# Patient Record
Sex: Male | Born: 1965
Health system: Southern US, Community
[De-identification: ages and names within clinical notes are randomized; demographics above are authoritative.]

## PROBLEM LIST (undated history)

## (undated) DIAGNOSIS — I10 Essential (primary) hypertension: Secondary | ICD-10-CM

## (undated) DIAGNOSIS — R002 Palpitations: Secondary | ICD-10-CM

## (undated) HISTORY — DX: Palpitations: R00.2

## (undated) HISTORY — PX: BACK SURGERY: SHX140

## (undated) HISTORY — PX: KNEE SURGERY: SHX244

---

## 2002-08-04 ENCOUNTER — Ambulatory Visit (HOSPITAL_COMMUNITY): Admission: RE | Admit: 2002-08-04 | Discharge: 2002-08-04 | Payer: Self-pay | Admitting: Family Medicine

## 2002-08-04 ENCOUNTER — Encounter: Payer: Self-pay | Admitting: Family Medicine

## 2003-02-10 ENCOUNTER — Encounter: Payer: Self-pay | Admitting: Family Medicine

## 2003-02-10 ENCOUNTER — Ambulatory Visit (HOSPITAL_COMMUNITY): Admission: RE | Admit: 2003-02-10 | Discharge: 2003-02-10 | Payer: Self-pay | Admitting: Family Medicine

## 2005-10-28 ENCOUNTER — Emergency Department (HOSPITAL_COMMUNITY): Admission: EM | Admit: 2005-10-28 | Discharge: 2005-10-28 | Payer: Self-pay | Admitting: Emergency Medicine

## 2005-11-14 ENCOUNTER — Ambulatory Visit (HOSPITAL_COMMUNITY): Admission: RE | Admit: 2005-11-14 | Discharge: 2005-11-14 | Payer: Self-pay | Admitting: Family Medicine

## 2007-04-11 ENCOUNTER — Ambulatory Visit (HOSPITAL_COMMUNITY): Admission: RE | Admit: 2007-04-11 | Discharge: 2007-04-11 | Payer: Self-pay | Admitting: Family Medicine
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
## 2011-08-16 ENCOUNTER — Ambulatory Visit (HOSPITAL_COMMUNITY)
Admission: RE | Admit: 2011-08-16 | Discharge: 2011-08-16 | Disposition: A | Discharge status: Home / Self Care | Payer: 59 | Source: Ambulatory Visit | Attending: Family Medicine | Admitting: Family Medicine

## 2011-08-16 ENCOUNTER — Other Ambulatory Visit (HOSPITAL_COMMUNITY): Payer: Self-pay | Admitting: Family Medicine

## 2011-08-16 DIAGNOSIS — N201 Calculus of ureter: Secondary | ICD-10-CM | POA: Insufficient documentation

## 2011-08-16 DIAGNOSIS — N200 Calculus of kidney: Secondary | ICD-10-CM

## 2011-08-16 DIAGNOSIS — R1031 Right lower quadrant pain: Secondary | ICD-10-CM | POA: Insufficient documentation

## 2011-08-16 DIAGNOSIS — R319 Hematuria, unspecified: Secondary | ICD-10-CM | POA: Insufficient documentation

## 2011-08-16 DIAGNOSIS — R1032 Left lower quadrant pain: Secondary | ICD-10-CM | POA: Insufficient documentation

## 2012-10-04 ENCOUNTER — Ambulatory Visit (HOSPITAL_COMMUNITY)
Admission: RE | Admit: 2012-10-04 | Discharge: 2012-10-04 | Disposition: A | Discharge status: Home / Self Care | Payer: 59 | Source: Ambulatory Visit | Attending: Internal Medicine | Admitting: Internal Medicine

## 2012-10-04 ENCOUNTER — Other Ambulatory Visit (HOSPITAL_COMMUNITY): Payer: Self-pay | Admitting: Internal Medicine

## 2012-10-04 DIAGNOSIS — R1013 Epigastric pain: Secondary | ICD-10-CM

## 2012-10-04 DIAGNOSIS — R1110 Vomiting, unspecified: Secondary | ICD-10-CM | POA: Insufficient documentation

## 2012-10-04 DIAGNOSIS — K7689 Other specified diseases of liver: Secondary | ICD-10-CM | POA: Insufficient documentation

## 2014-01-30 ENCOUNTER — Emergency Department (HOSPITAL_COMMUNITY)
Admission: EM | Admit: 2014-01-30 | Discharge: 2014-01-30 | Disposition: A | Discharge status: Home / Self Care | Payer: 59 | Attending: Emergency Medicine | Admitting: Emergency Medicine

## 2014-01-30 ENCOUNTER — Encounter (HOSPITAL_COMMUNITY): Payer: Self-pay | Admitting: Emergency Medicine

## 2014-01-30 ENCOUNTER — Emergency Department (HOSPITAL_COMMUNITY): Payer: 59

## 2014-01-30 DIAGNOSIS — R109 Unspecified abdominal pain: Secondary | ICD-10-CM | POA: Insufficient documentation

## 2014-01-30 DIAGNOSIS — Z79899 Other long term (current) drug therapy: Secondary | ICD-10-CM | POA: Insufficient documentation

## 2014-01-30 DIAGNOSIS — Z791 Long term (current) use of non-steroidal anti-inflammatories (NSAID): Secondary | ICD-10-CM | POA: Insufficient documentation

## 2014-01-30 DIAGNOSIS — N201 Calculus of ureter: Secondary | ICD-10-CM | POA: Insufficient documentation

## 2014-01-30 LAB — URINALYSIS, ROUTINE W REFLEX MICROSCOPIC
Bilirubin Urine: NEGATIVE
GLUCOSE, UA: NEGATIVE mg/dL
HGB URINE DIPSTICK: NEGATIVE
KETONES UR: 15 mg/dL — AB
LEUKOCYTES UA: NEGATIVE
NITRITE: NEGATIVE
PH: 5.5 (ref 5.0–8.0)
PROTEIN: NEGATIVE mg/dL
SPECIFIC GRAVITY, URINE: 1.025 (ref 1.005–1.030)
UROBILINOGEN UA: 0.2 mg/dL (ref 0.0–1.0)

## 2014-01-30 LAB — COMPREHENSIVE METABOLIC PANEL
ALT: 34 U/L (ref 0–53)
AST: 31 U/L (ref 0–37)
Albumin: 4.5 g/dL (ref 3.5–5.2)
Alkaline Phosphatase: 62 U/L (ref 39–117)
Anion gap: 15 (ref 5–15)
BILIRUBIN TOTAL: 1.1 mg/dL (ref 0.3–1.2)
BUN: 21 mg/dL (ref 6–23)
CO2: 24 meq/L (ref 19–32)
Calcium: 9.7 mg/dL (ref 8.4–10.5)
Chloride: 102 mEq/L (ref 96–112)
Creatinine, Ser: 1.6 mg/dL — ABNORMAL HIGH (ref 0.50–1.35)
GFR calc Af Amer: 57 mL/min — ABNORMAL LOW (ref >90)
GFR calc non Af Amer: 49 mL/min — ABNORMAL LOW (ref >90)
Glucose, Bld: 114 mg/dL — ABNORMAL HIGH (ref 70–99)
POTASSIUM: 4.4 meq/L (ref 3.7–5.3)
Sodium: 141 mEq/L (ref 137–147)
TOTAL PROTEIN: 7.9 g/dL (ref 6.0–8.3)

## 2014-01-30 LAB — CBC
HEMATOCRIT: 41.8 % (ref 39.0–52.0)
Hemoglobin: 14.9 g/dL (ref 13.0–17.0)
MCH: 32.3 pg (ref 26.0–34.0)
MCHC: 35.6 g/dL (ref 30.0–36.0)
MCV: 90.7 fL (ref 78.0–100.0)
Platelets: 240 10*3/uL (ref 150–400)
RBC: 4.61 MIL/uL (ref 4.22–5.81)
RDW: 12 % (ref 11.5–15.5)
WBC: 14.3 10*3/uL — ABNORMAL HIGH (ref 4.0–10.5)

## 2014-01-30 MED ORDER — TAMSULOSIN HCL 0.4 MG PO CAPS: 0.4000 mg | ORAL_CAPSULE | Freq: Every day | ORAL | Status: DC

## 2014-01-30 MED ORDER — SODIUM CHLORIDE 0.9 % IV SOLN
1000.0000 mL | INTRAVENOUS | Status: DC
Start: 2014-01-30 — End: 2014-01-31

## 2014-01-30 MED ORDER — HYDROCODONE-ACETAMINOPHEN 5-325 MG PO TABS: 1.0000 | ORAL_TABLET | ORAL | Status: DC | PRN

## 2014-01-30 MED ORDER — SODIUM CHLORIDE 0.9 % IV SOLN: 1000.0000 mL | Freq: Once | INTRAVENOUS | Status: DC

## 2014-01-30 MED ORDER — ONDANSETRON HCL 8 MG PO TABS: 8.0000 mg | ORAL_TABLET | Freq: Three times a day (TID) | ORAL | Status: DC | PRN

## 2014-01-30 MED ORDER — SODIUM CHLORIDE 0.9 % IV SOLN
1000.0000 mL | Freq: Once | INTRAVENOUS | Status: AC
Start: 2014-01-30 — End: 2014-01-30
  Administered 2014-01-30: 1000 mL via INTRAVENOUS

## 2014-01-30 MED ORDER — ONDANSETRON HCL 4 MG/2ML IJ SOLN
4.0000 mg | Freq: Once | INTRAMUSCULAR | Status: AC
  Administered 2014-01-30: 4 mg via INTRAVENOUS
  Filled 2014-01-30: qty 2

## 2014-01-30 MED ORDER — HYDROMORPHONE HCL PF 1 MG/ML IJ SOLN
1.0000 mg | INTRAMUSCULAR | Status: DC | PRN
  Administered 2014-01-30: 1 mg via INTRAVENOUS
  Filled 2014-01-30: qty 1

## 2014-01-30 MED ORDER — HYDROMORPHONE HCL PF 1 MG/ML IJ SOLN
1.0000 mg | Freq: Once | INTRAMUSCULAR | Status: AC
  Administered 2014-01-30: 1 mg via INTRAVENOUS
  Filled 2014-01-30: qty 1

## 2014-01-30 MED ORDER — TAMSULOSIN HCL 0.4 MG PO CAPS
0.4000 mg | ORAL_CAPSULE | Freq: Once | ORAL | Status: AC
  Administered 2014-01-30: 0.4 mg via ORAL
  Filled 2014-01-30: qty 1

## 2014-01-30 NOTE — Discharge Instructions (Signed)
Kidney Stones °Kidney stones (urolithiasis) are deposits that form inside your kidneys. The intense pain is caused by the stone moving through the urinary tract. When the stone moves, the ureter goes into spasm around the stone. The stone is usually passed in the urine.  °CAUSES  °· A disorder that makes certain neck glands produce too much parathyroid hormone (primary hyperparathyroidism). °· A buildup of uric acid crystals, similar to gout in your joints. °· Narrowing (stricture) of the ureter. °· A kidney obstruction present at birth (congenital obstruction). °· Previous surgery on the kidney or ureters. °· Numerous kidney infections. °SYMPTOMS  °· Feeling sick to your stomach (nauseous). °· Throwing up (vomiting). °· Blood in the urine (hematuria). °· Pain that usually spreads (radiates) to the groin. °· Frequency or urgency of urination. °DIAGNOSIS  °· Taking a history and physical exam. °· Blood or urine tests. °· CT scan. °· Occasionally, an examination of the inside of the urinary bladder (cystoscopy) is performed. °TREATMENT  °· Observation. °· Increasing your fluid intake. °· Extracorporeal shock wave lithotripsy--This is a noninvasive procedure that uses shock waves to break up kidney stones. °· Surgery may be needed if you have severe pain or persistent obstruction. There are various surgical procedures. Most of the procedures are performed with the use of small instruments. Only small incisions are needed to accommodate these instruments, so recovery time is minimized. °The size, location, and chemical composition are all important variables that will determine the proper choice of action for you. Talk to your health care provider to better understand your situation so that you will minimize the risk of injury to yourself and your kidney.  °HOME CARE INSTRUCTIONS  °· Drink enough water and fluids to keep your urine clear or pale yellow. This will help you to pass the stone or stone fragments. °· Strain  all urine through the provided strainer. Keep all particulate matter and stones for your health care provider to see. The stone causing the pain may be as small as a grain of salt. It is very important to use the strainer each and every time you pass your urine. The collection of your stone will allow your health care provider to analyze it and verify that a stone has actually passed. The stone analysis will often identify what you can do to reduce the incidence of recurrences. °· Only take over-the-counter or prescription medicines for pain, discomfort, or fever as directed by your health care provider. °· Make a follow-up appointment with your health care provider as directed. °· Get follow-up X-rays if required. The absence of pain does not always mean that the stone has passed. It may have only stopped moving. If the urine remains completely obstructed, it can cause loss of kidney function or even complete destruction of the kidney. It is your responsibility to make sure X-rays and follow-ups are completed. Ultrasounds of the kidney can show blockages and the status of the kidney. Ultrasounds are not associated with any radiation and can be performed easily in a matter of minutes. °SEEK MEDICAL CARE IF: °· You experience pain that is progressive and unresponsive to any pain medicine you have been prescribed. °SEEK IMMEDIATE MEDICAL CARE IF:  °· Pain cannot be controlled with the prescribed medicine. °· You have a fever or shaking chills. °· The severity or intensity of pain increases over 18 hours and is not relieved by pain medicine. °· You develop a new onset of abdominal pain. °· You feel faint or pass out. °·   You are unable to urinate. MAKE SURE YOU:   Understand these instructions.  Will watch your condition.  Will get help right away if you are not doing well or get worse. Document Released: 05/15/2005 Document Revised: 01/15/2013 Document Reviewed: 10/16/2012 [facility] Patient Information 2015  [place], [place]. This information is not intended to replace advice given to you by your health care provider. Make sure you discuss any questions you have with your health care provider.   Take your next dose of the flomax tomorrow evening.  Strain your urine so you will know when the kidney stone passes.  Return here if you develop fevers, uncontrolled vomiting or worse pain.  You may benefit from seeing a urologist - see the referral above for this.

## 2014-01-30 NOTE — ED Notes (Signed)
Onset today, sudden pain, Pain on right side, flank pain, hx of kidney stones, vomiting and nausea

## 2014-01-30 NOTE — ED Provider Notes (Signed)
CSN: [id number]     Arrival date & time 01/30/14  1941 History   First MD Initiated Contact with Patient 01/30/14 2202     Chief Complaint  Patient presents with  . Flank Pain     (Consider location/radiation/quality/duration/timing/severity/associated sxs/prior Treatment) The history is provided by the patient.   Jose Oliver is a 48 y.o. male presenting with acute and sudden pain his right flank starting mid afternoon today.  This was accompanied by nausea with vomiting and was not accompanied by abdominal pain, hematuria or increased urinary frequency.  He has had no fevers or chills. He does have a history of kidney stones,  The last occurred approximately 2 years ago which he passed without complication.  He took an old, expired vicodin prior to arrival which did not relieve his pain.      No past medical history on file. Past Surgical History  Procedure Laterality Date  . Back surgery    . Knee surgery     No family history on file. History  Substance Use Topics  . Smoking status: Never Smoker   . Smokeless tobacco: Not on file  . Alcohol Use: Yes     Comment: socially    Review of Systems    Allergies  Review of patient's allergies indicates no known allergies.  Home Medications   Prior to Admission medications   Medication Sig Start Date End Date Taking? Authorizing Provider  losartan (COZAAR) 100 MG tablet Take 100 mg by mouth daily. 12/05/13  Yes Historical Provider, MD  meloxicam (MOBIC) 15 MG tablet Take 15 mg by mouth daily as needed. For pain 12/05/13  Yes Historical Provider, MD  HYDROcodone-acetaminophen (NORCO/VICODIN) 5-325 MG per tablet Take 1-2 tablets by mouth every 4 (four) hours as needed. 01/30/14   Evalee Jefferson, PA-C  ondansetron (ZOFRAN) 8 MG tablet Take 1 tablet (8 mg total) by mouth every 8 (eight) hours as needed for nausea or vomiting. 01/30/14   Evalee Jefferson, PA-C  tamsulosin (FLOMAX) 0.4 MG CAPS capsule Take 1 capsule (0.4 mg total) by mouth daily  after supper. 01/30/14   Evalee Jefferson, PA-C   BP 133/80  Pulse 66  Temp(Src) 98.4 F (36.9 C) (Oral)  Resp 18  Ht 5\' 11"  (1.803 m)  Wt 214 lb (97.07 kg)  BMI 29.86 kg/m2  SpO2 99% Physical Exam  ED Course  Procedures (including critical care time) Labs Review Labs Reviewed  URINALYSIS, ROUTINE W REFLEX MICROSCOPIC - Abnormal; Notable for the following:    Ketones, ur 15 (*)    All other components within normal limits  CBC - Abnormal; Notable for the following:    WBC 14.3 (*)    All other components within normal limits  COMPREHENSIVE METABOLIC PANEL - Abnormal; Notable for the following:    Glucose, Bld 114 (*)    Creatinine, Ser 1.60 (*)    GFR calc non Af Amer 49 (*)    GFR calc Af Amer 57 (*)    All other components within normal limits    Imaging Review Ct Abdomen Pelvis Wo Contrast  01/30/2014   CLINICAL DATA:  Right-sided flank pain with nausea and vomiting.  EXAM: CT ABDOMEN AND PELVIS WITHOUT CONTRAST  TECHNIQUE: Multidetector CT imaging of the abdomen and pelvis was performed following the standard protocol without IV contrast.  COMPARISON:  08/16/2011  FINDINGS: Mild right hydronephrosis is identified. There is a calculus located in the proximal ureter measuring approximately 3 mm in diameter and located approximately  at the level of the superior endplate of the L3 vertebral body. No other calculi are identified in either kidney, ureters or bladder.  Unenhanced appearance of the liver, gallbladder, pancreas, spleen, adrenal glands and bowel are unremarkable. No abnormal fluid collections. No masses or enlarged lymph nodes are seen. Degenerative disease of the lower lumbar spine seen at the L4-5 and L5-S1 levels.  IMPRESSION: Mild right hydronephrosis secondary to a 3 mm proximal ureteral calculus.   Electronically Signed   By: Aletta Edouard M.D.   On: 01/30/2014 22:10     EKG Interpretation None      MDM   Final diagnoses:  Right ureteral stone    Patients labs  and/or radiological studies were viewed and considered during the medical decision making and disposition process. Patient was prescribed hydrocodone, Zofran and Flomax.  He was given his first dose of Flomax tonight before discharge home.  Advised to followup with his PCP as needed, he was also given a urology referral.  He was also instructed to return here over the weekend if he develops fevers, worse pain or uncontrolled vomiting.    Evalee Jefferson, PA-C 01/30/14 2304

## 2014-02-01 NOTE — ED Provider Notes (Signed)
Medical screening examination/treatment/procedure(s) were performed by non-physician practitioner and as supervising physician I was immediately available for consultation/collaboration.   EKG Interpretation None        Fredia Sorrow, MD 02/01/14 0201

## 2014-02-23 ENCOUNTER — Other Ambulatory Visit (HOSPITAL_COMMUNITY): Payer: Self-pay | Admitting: Family Medicine

## 2014-02-23 ENCOUNTER — Ambulatory Visit (HOSPITAL_COMMUNITY)
Admission: RE | Admit: 2014-02-23 | Discharge: 2014-02-23 | Disposition: A | Discharge status: Home / Self Care | Payer: 59 | Source: Ambulatory Visit | Attending: Family Medicine | Admitting: Family Medicine

## 2014-02-23 DIAGNOSIS — N200 Calculus of kidney: Secondary | ICD-10-CM | POA: Insufficient documentation

## 2016-02-01 NOTE — H&P (Signed)
  NTS SOAP Note  Vital Signs:  Vitals as of: 0000000: Systolic 0000000: Diastolic 90: Heart Rate 77: Temp 97.12F (Temporal): Height 62ft 11in: Weight 223Lbs 0 Ounces: BMI 31.1   BMI : 31.1 kg/m2  Subjective: This 50 year old male presents for of referral for need for screening TCS.  Never has had a colonoscopy.  No family h/o colon cancer.  Denies, weight loss, blood in stools, abdominal pain, diarrhea, constipation.  Review of Symptoms:  Constitutional:negative Head:negative Eyes:negative Nose/Mouth/Throat:negative Cardiovascular:negative Respiratory:negative Gastrointestinnegative Genitourinary:negative joint and back pain Skin:negative Hematolgic/Lymphatic:negative Allergic/Immunologic:negative   Past Medical History:Reviewed  Past Medical History  Surgical History: bilateral knee, back surgeries Medical Problems: HTN, BPH Allergies: nkda Medications: baby asa, cozaar, mobic prn, tamsulosin   Social History:Reviewed  Social History  Preferred Language: English Race:  White Ethnicity: Not Hispanic / Latino Age: 77 year Marital Status:  M Alcohol: no   Smoking Status: Never smoker reviewed on 02/01/2016 Functional Status reviewed on 02/01/2016 ------------------------------------------------ Bathing: Normal Cooking: Normal Dressing: Normal Driving: Normal Eating: Normal Managing Meds: Normal Oral Care: Normal Shopping: Normal Toileting: Normal Transferring: Normal Walking: Normal Cognitive Status reviewed on 02/01/2016 ------------------------------------------------ Attention: Normal Decision Making: Normal Language: Normal Memory: Normal Motor: Normal Perception: Normal Problem Solving: Normal Visual and Spatial: Normal   Family History:Reviewed  Family Health History Mother, Living; Diverticulitis of colon;  Father, Deceased; Prostate cancer;     Objective Information: General:Well appearing, well nourished in no  distress. Head:Atraumatic; no masses; no abnormalities Neck:Supple without lymphadenopathy.  Heart:RRR, no murmur or gallop.  Normal S1, S2.  No S3, S4.  Lungs:CTA bilaterally, no wheezes, rhonchi, rales.  Breathing unlabored. Abdomen:Soft, NT/ND, normal bowel sounds, no HSM, no masses.  No peritoneal signs. deferrred to procedure Dr. Delanna Ahmadi notes reviewed Assessment:Need for screening TCS  Diagnoses: V76.51  Z12.11 Screening for malignant neoplasm of colon (Encounter for screening for malignant neoplasm of colon)  Procedures: [id number] - OFFICE OUTPATIENT NEW 30 MINUTES    Plan:  Scheduled for screening TCS on 02/15/16.  Trilyte prescribed.   Patient Education:Alternative treatments to surgery were discussed with patient (and family).Risks and benefits  of procedure including bleeding and perforation were fully explained to the patient (and family) who gave informed consent. Patient/family questions were addressed.  Follow-up:Pending Surgery

## 2016-02-15 ENCOUNTER — Encounter (HOSPITAL_COMMUNITY): Payer: Self-pay

## 2016-02-15 ENCOUNTER — Ambulatory Visit (HOSPITAL_COMMUNITY): Admit: 2016-02-15 | Payer: Self-pay | Admitting: General Surgery

## 2016-02-15 SURGERY — COLONOSCOPY
Anesthesia: Moderate Sedation

## 2016-05-05 ENCOUNTER — Other Ambulatory Visit (HOSPITAL_COMMUNITY): Payer: Self-pay | Admitting: Family Medicine

## 2016-05-05 ENCOUNTER — Ambulatory Visit (HOSPITAL_COMMUNITY)
Admission: RE | Admit: 2016-05-05 | Discharge: 2016-05-05 | Disposition: A | Discharge status: Home / Self Care | Payer: 59 | Source: Ambulatory Visit | Attending: Family Medicine | Admitting: Family Medicine

## 2016-05-05 DIAGNOSIS — M25552 Pain in left hip: Secondary | ICD-10-CM | POA: Insufficient documentation

## 2016-05-05 DIAGNOSIS — M1612 Unilateral primary osteoarthritis, left hip: Secondary | ICD-10-CM | POA: Insufficient documentation

## 2016-05-17 ENCOUNTER — Telehealth: Payer: Self-pay

## 2016-05-17 NOTE — Telephone Encounter (Signed)
[phone] PATIENT RECEIVED LETTER TO SCHEDULE TCS

## 2016-05-25 ENCOUNTER — Telehealth: Payer: Self-pay

## 2016-05-25 NOTE — Telephone Encounter (Signed)
LMOM to call.

## 2016-05-25 NOTE — Telephone Encounter (Signed)
Pt has been triaged.

## 2016-06-01 NOTE — Telephone Encounter (Signed)
Gastroenterology Pre-Procedure Review  Request Date: 05/25/2016 Requesting Physician: Dr. Gerarda Fraction  PATIENT REVIEW QUESTIONS: The patient responded to the following health history questions as indicated:    1. Diabetes Melitis: no 2. Joint replacements in the past 12 months: no 3. Major health problems in the past 3 months: no 4. Has an artificial valve or MVP: no 5. Has a defibrillator: no 6. Has been advised in past to take antibiotics in advance of a procedure like teeth cleaning: no 7. Family history of colon cancer: YES    Father 8. Alcohol Use: no 9. History of sleep apnea: no  10. History of coronary artery or other vascular stents placed within the last 12 months: no    MEDICATIONS & ALLERGIES:    Patient reports the following regarding taking any blood thinners:   Plavix? no Aspirin? no Coumadin? no Brilinta? no Xarelto? no Eliquis? no Pradaxa? no Savaysa? no Effient? no  Patient confirms/reports the following medications:  Current Outpatient Prescriptions  Medication Sig Dispense Refill  . losartan (COZAAR) 100 MG tablet Take 100 mg by mouth daily.    . meloxicam (MOBIC) 15 MG tablet Take 15 mg by mouth daily as needed. For pain    . tamsulosin (FLOMAX) 0.4 MG CAPS capsule Take 1 capsule (0.4 mg total) by mouth daily after supper. (Patient not taking: Reported on 05/25/2016) 5 capsule 0   No current facility-administered medications for this visit.     Patient confirms/reports the following allergies:  No Known Allergies  No orders of the defined types were placed in this encounter.   AUTHORIZATION INFORMATION Primary Insurance:   ID #:Group #:  Pre-Cert / Auth required:  Pre-Cert / Auth #:   Secondary Insurance:   ID #:   Group #:  Pre-Cert / Auth required:  Pre-Cert / Auth #:   SCHEDULE INFORMATION: Procedure has been scheduled as follows:  Date: 06/29/2016             Time:  8:30 AM Location: [facility]  This Gastroenterology  Pre-Precedure Review Form is being routed to the following provider(s): R. Garfield Cornea, MD

## 2016-06-07 NOTE — Telephone Encounter (Signed)
Ok to schedule.

## 2016-06-08 ENCOUNTER — Other Ambulatory Visit: Payer: Self-pay

## 2016-06-08 DIAGNOSIS — Z1211 Encounter for screening for malignant neoplasm of colon: Secondary | ICD-10-CM

## 2016-06-08 MED ORDER — NA SULFATE-K SULFATE-MG SULF 17.5-3.13-1.6 GM/177ML PO SOLN: 1.0000 | ORAL | 0 refills | Status: DC

## 2016-06-08 NOTE — Telephone Encounter (Signed)
Rx sent to the pharmacy and instructions mailed to pt.  

## 2016-06-26 ENCOUNTER — Telehealth: Payer: Self-pay

## 2016-06-26 NOTE — Telephone Encounter (Signed)
I called pt to update triage and he has not had any change in his meds since he was triaged.

## 2016-06-27 ENCOUNTER — Telehealth: Payer: Self-pay

## 2016-06-27 NOTE — Telephone Encounter (Signed)
Ok to schedule.

## 2016-06-27 NOTE — Telephone Encounter (Signed)
I called [employer] @ [phone] and spoke to [place] who said PA is required for the screening colonoscopy as outpatient.  PA# [id number].

## 2016-06-29 ENCOUNTER — Encounter (HOSPITAL_COMMUNITY): Payer: Self-pay | Admitting: *Deleted

## 2016-06-29 ENCOUNTER — Ambulatory Visit (HOSPITAL_COMMUNITY)
Admission: RE | Admit: 2016-06-29 | Discharge: 2016-06-29 | Disposition: A | Discharge status: Home / Self Care | Payer: 59 | Source: Ambulatory Visit | Attending: Internal Medicine | Admitting: Internal Medicine

## 2016-06-29 ENCOUNTER — Encounter (HOSPITAL_COMMUNITY)
Admission: RE | Disposition: A | Discharge status: Home / Self Care | Payer: Self-pay | Source: Ambulatory Visit | Attending: Internal Medicine

## 2016-06-29 DIAGNOSIS — Z79899 Other long term (current) drug therapy: Secondary | ICD-10-CM | POA: Diagnosis not present

## 2016-06-29 DIAGNOSIS — D124 Benign neoplasm of descending colon: Secondary | ICD-10-CM

## 2016-06-29 DIAGNOSIS — Z1211 Encounter for screening for malignant neoplasm of colon: Secondary | ICD-10-CM | POA: Diagnosis not present

## 2016-06-29 DIAGNOSIS — Z1212 Encounter for screening for malignant neoplasm of rectum: Secondary | ICD-10-CM

## 2016-06-29 DIAGNOSIS — I10 Essential (primary) hypertension: Secondary | ICD-10-CM | POA: Diagnosis not present

## 2016-06-29 DIAGNOSIS — K5730 Diverticulosis of large intestine without perforation or abscess without bleeding: Secondary | ICD-10-CM | POA: Insufficient documentation

## 2016-06-29 DIAGNOSIS — K6389 Other specified diseases of intestine: Secondary | ICD-10-CM | POA: Insufficient documentation

## 2016-06-29 HISTORY — PX: POLYPECTOMY: SHX5525

## 2016-06-29 HISTORY — PX: COLONOSCOPY: SHX5424

## 2016-06-29 HISTORY — DX: Essential (primary) hypertension: I10

## 2016-06-29 SURGERY — COLONOSCOPY
Anesthesia: Moderate Sedation

## 2016-06-29 MED ORDER — MEPERIDINE HCL 100 MG/ML IJ SOLN
INTRAMUSCULAR | Status: DC
Start: 2016-06-29 — End: 2016-06-29
  Filled 2016-06-29: qty 2

## 2016-06-29 MED ORDER — MIDAZOLAM HCL 5 MG/5ML IJ SOLN
INTRAMUSCULAR | Status: AC
  Filled 2016-06-29: qty 10

## 2016-06-29 MED ORDER — MEPERIDINE HCL 100 MG/ML IJ SOLN
INTRAMUSCULAR | Status: DC | PRN
  Administered 2016-06-29: 50 mg via INTRAVENOUS
  Administered 2016-06-29 (×2): 25 mg via INTRAVENOUS

## 2016-06-29 MED ORDER — ONDANSETRON HCL 4 MG/2ML IJ SOLN
INTRAMUSCULAR | Status: DC | PRN
  Administered 2016-06-29: 4 mg via INTRAVENOUS

## 2016-06-29 MED ORDER — STERILE WATER FOR IRRIGATION IR SOLN
Status: DC | PRN
  Administered 2016-06-29: 2.5 mL

## 2016-06-29 MED ORDER — SODIUM CHLORIDE 0.9 % IV SOLN
INTRAVENOUS | Status: DC
  Administered 2016-06-29: 07:00:00 via INTRAVENOUS

## 2016-06-29 MED ORDER — MIDAZOLAM HCL 5 MG/5ML IJ SOLN
INTRAMUSCULAR | Status: DC | PRN
  Administered 2016-06-29 (×2): 1 mg via INTRAVENOUS
  Administered 2016-06-29 (×2): 2 mg via INTRAVENOUS

## 2016-06-29 MED ORDER — ONDANSETRON HCL 4 MG/2ML IJ SOLN
INTRAMUSCULAR | Status: AC
  Filled 2016-06-29: qty 2

## 2016-06-29 NOTE — Discharge Instructions (Addendum)
Diverticulosis Diverticulosis is the condition that develops when small pouches (diverticula) form in the wall of your colon. Your colon, or large intestine, is where water is absorbed and stool is formed. The pouches form when the inside layer of your colon pushes through weak spots in the outer layers of your colon. CAUSES  No one knows exactly what causes diverticulosis. RISK FACTORS  Being older than 15. Your risk for this condition increases with age. Diverticulosis is rare in people younger than 40 years. By age 21, almost everyone has it.  Eating a low-fiber diet.  Being frequently constipated.  Being overweight.  Not getting enough exercise.  Smoking.  Taking over-the-counter pain medicines, like aspirin and ibuprofen. SYMPTOMS  Most people with diverticulosis do not have symptoms. DIAGNOSIS  Because diverticulosis often has no symptoms, health care providers often discover the condition during an exam for other colon problems. In many cases, a health care provider will diagnose diverticulosis while using a flexible scope to examine the colon (colonoscopy). TREATMENT  If you have never developed an infection related to diverticulosis, you may not need treatment. If you have had an infection before, treatment may include:  Eating more fruits, vegetables, and grains.  Taking a fiber supplement.  Taking a live bacteria supplement (probiotic).  Taking medicine to relax your colon. HOME CARE INSTRUCTIONS   Drink at least 6-8 glasses of water each day to prevent constipation.  Try not to strain when you have a bowel movement.  Keep all follow-up appointments. If you have had an infection before:  Increase the fiber in your diet as directed by your health care provider or dietitian.  Take a dietary fiber supplement if your health care provider approves.  Only take medicines as directed by your health care provider. SEEK MEDICAL CARE IF:   You have abdominal  pain.  You have bloating.  You have cramps.  You have not gone to the bathroom in 3 days. SEEK IMMEDIATE MEDICAL CARE IF:   Your pain gets worse.  Yourbloating becomes very bad.  You have a fever or chills, and your symptoms suddenly get worse.  You begin vomiting.  You have bowel movements that are bloody or black. MAKE SURE YOU:  Understand these instructions.  Will watch your condition.  Will get help right away if you are not doing well or get worse. This information is not intended to replace advice given to you by your health care provider. Make sure you discuss any questions you have with your health care provider. Document Released: 02/10/2004 Document Revised: 05/20/2013 Document Reviewed: 04/09/2013 Elsevier Interactive Patient Education  2017 [employer]   Colonoscopy, Adult, Care After This sheet gives you information about how to care for yourself after your procedure. Your health care provider may also give you more specific instructions. If you have problems or questions, contact your health care provider. What can I expect after the procedure? After the procedure, it is common to have:  A small amount of blood in your stool for 24 hours after the procedure.  Some gas.  Mild abdominal cramping or bloating. Follow these instructions at home: General instructions  For the first 24 hours after the procedure:  Do not drive or use machinery.  Do not sign important documents.  Do not drink alcohol.  Do your regular daily activities at a slower pace than normal.  Eat soft, easy-to-digest foods.  Rest often.  Take over-the-counter or prescription medicines only as told by your health care provider.  It is up to you to get the results of your procedure. Ask your health care provider, or the department performing the procedure, when your results will be ready. Relieving cramping and bloating  Try walking around when you have cramps or feel  bloated.  Apply heat to your abdomen as told by your health care provider. Use a heat source that your health care provider recommends, such as a moist heat pack or a heating pad.  Place a towel between your skin and the heat source.  Leave the heat on for 20-30 minutes.  Remove the heat if your skin turns bright red. This is especially important if you are unable to feel pain, heat, or cold. You may have a greater risk of getting burned. Eating and drinking  Drink enough fluid to keep your urine clear or pale yellow.  Resume your normal diet as instructed by your health care provider. Avoid heavy or fried foods that are hard to digest.  Avoid drinking alcohol for as long as instructed by your health care provider. Contact a health care provider if:  You have blood in your stool 2-3 days after the procedure. Get help right away if:  You have more than a small spotting of blood in your stool.  You pass large blood clots in your stool.  Your abdomen is swollen.  You have nausea or vomiting.  You have a fever.  You have increasing abdominal pain that is not relieved with medicine. This information is not intended to replace advice given to you by your health care provider. Make sure you discuss any questions you have with your health care provider. Document Released: 12/28/2003 Document Revised: 02/07/2016 Document Reviewed: 07/27/2015 Elsevier Interactive Patient Education  2017 [employer]vier Inc.  Colonoscopy Discharge Instructions  Read the instructions outlined below and refer to this sheet in the next few weeks. These discharge instructions provide you with general information on caring for yourself after you leave the hospital. Your doctor may also give you specific instructions. While your treatment has been planned according to the most current medical practices available, unavoidable complications occasionally occur. If you have any problems or questions after discharge, call  Dr. Gala Romney at [phone]. ACTIVITY  You may resume your regular activity, but move at a slower pace for the next 24 hours.   Take frequent rest periods for the next 24 hours.   Walking will help get rid of the air and reduce the bloated feeling in your belly (abdomen).   No driving for 24 hours (because of the medicine (anesthesia) used during the test).    Do not sign any important legal documents or operate any machinery for 24 hours (because of the anesthesia used during the test).  NUTRITION  Drink plenty of fluids.   You may resume your normal diet as instructed by your doctor.   Begin with a light meal and progress to your normal diet. Heavy or fried foods are harder to digest and may make you feel sick to your stomach (nauseated).   Avoid alcoholic beverages for 24 hours or as instructed.  MEDICATIONS  You may resume your normal medications unless your doctor tells you otherwise.  WHAT YOU CAN EXPECT TODAY  Some feelings of bloating in the abdomen.   Passage of more gas than usual.   Spotting of blood in your stool or on the toilet paper.  IF YOU HAD POLYPS REMOVED DURING THE COLONOSCOPY:  No aspirin products for 7 days or as instructed.  No alcohol for 7 days or as instructed.   Eat a soft diet for the next 24 hours.  FINDING OUT THE RESULTS OF YOUR TEST Not all test results are available during your visit. If your test results are not back during the visit, make an appointment with your caregiver to find out the results. Do not assume everything is normal if you have not heard from your caregiver or the medical facility. It is important for you to follow up on all of your test results.  SEEK IMMEDIATE MEDICAL ATTENTION IF:  You have more than a spotting of blood in your stool.   Your belly is swollen (abdominal distention).   You are nauseated or vomiting.   You have a temperature over 101.   You have abdominal pain or discomfort that is severe or gets worse  throughout the day.    Colon diverticulosis and polyp information provided  Further recommendations to follow pending review of pathology report

## 2016-06-29 NOTE — H&P (Signed)
@  DJSH@   Primary Care Physician:  Purvis Kilts, MD Primary Gastroenterologist:  Dr. Gala Romney  Pre-Procedure History & Physical: HPI:  Jose Oliver is a 51 y.o. male is here for a screening colonoscopy. No bowel symptoms. No family history of colon cancer. No prior colonoscopy.  Past Medical History:  Diagnosis Date  . Hypertension     Past Surgical History:  Procedure Laterality Date  . BACK SURGERY    . KNEE SURGERY      Prior to Admission medications   Medication Sig Start Date End Date Taking? Authorizing Provider  losartan (COZAAR) 100 MG tablet Take 100 mg by mouth daily. 12/05/13  Yes Historical Provider, MD  meloxicam (MOBIC) 15 MG tablet Take 15 mg by mouth daily as needed. For pain 12/05/13  Yes Historical Provider, MD  Multiple Vitamins-Minerals (MULTIVITAMIN WITH MINERALS) tablet Take 1 tablet by mouth daily.   Yes Historical Provider, MD  Na Sulfate-K Sulfate-Mg Sulf (SUPREP BOWEL PREP KIT) 17.5-3.13-1.6 GM/180ML SOLN Take 1 kit by mouth as directed. 06/08/16  Yes Daneil Dolin, MD  Omega-3 Fatty Acids (FISH OIL) 1000 MG CAPS Take 1 capsule by mouth daily.   Yes Historical Provider, MD    Allergies as of 06/08/2016  . (No Known Allergies)    Family History  Problem Relation Age of Onset  . Colon cancer Neg Hx     Social History   Social History  . Marital status: Married    Spouse name: N/A  . Number of children: N/A  . Years of education: N/A   Occupational History  . Not on file.   Social History Main Topics  . Smoking status: Never Smoker  . Smokeless tobacco: Never Used  . Alcohol use Yes     Comment: socially  . Drug use: No  . Sexual activity: Not on file   Other Topics Concern  . Not on file   Social History Narrative  . No narrative on file    Review of Systems: See HPI, otherwise negative ROS  Physical Exam: BP 129/83   Pulse 91   Temp 98 F (36.7 C) (Oral)   Resp 16   Ht '5\' 11"'$  (1.803 m)   Wt 220 lb (99.8 kg)   SpO2  96%   BMI 30.68 kg/m  General:   Alert,  Well-developed, well-nourished, pleasant and cooperative in NAD Head:  Normocephalic and atraumatic. Lungs:  Clear throughout to auscultation.   No wheezes, crackles, or rhonchi. No acute distress. Heart:  Regular rate and rhythm; no murmurs, clicks, rubs,  or gallops. Abdomen:  Soft, nontender and nondistended. No masses, hepatosplenomegaly or hernias noted. Normal bowel sounds, without guarding, and without rebound.   Impression/Plan: Jose Oliver is now here to undergo a screening colonoscopy.  First ever average risk screening examination       Risks, benefits, limitations, imponderables and alternatives regarding colonoscopy have been reviewed with the patient. Questions have been answered. All parties agreeable.     Notice:  This dictation was prepared with Dragon dictation along with smaller phrase technology. Any transcriptional errors that result from this process are unintentional and may not be corrected upon review.

## 2016-06-29 NOTE — Op Note (Signed)
[facility] Patient Name: Jose Oliver Procedure Date: 06/29/2016 7:52 AM MRN: [id number] Date of Birth: September 18, 1965 Attending MD: Norvel Richards , MD CSN: [id number] Age: 51 Admit Type: Outpatient Procedure:                Colonoscopy with snare poypectomy Indications:              Screening for colorectal malignant neoplasm Providers:                Norvel Richards, MD, Lurline Del, RN, Aram Candela Referring MD:             Halford Chessman Medicines:                Midazolam 6 mg IV, Meperidine 100 mg IV,                            Ondansetron 4 mg IV Complications:            No immediate complications. Estimated Blood Loss:     Estimated blood loss was minimal. Procedure:                Pre-Anesthesia Assessment:                           - Prior to the procedure, a History and Physical                            was performed, and patient medications and                            allergies were reviewed. The patient's tolerance of                            previous anesthesia was also reviewed. The risks                            and benefits of the procedure and the sedation                            options and risks were discussed with the patient.                            All questions were answered, and informed consent                            was obtained. Prior Anticoagulants: The patient has                            taken no previous anticoagulant or antiplatelet                            agents. ASA Grade Assessment: II - A patient with  mild systemic disease. After reviewing the risks                            and benefits, the patient was deemed in                            satisfactory condition to undergo the procedure.                           After obtaining informed consent, the colonoscope                            was passed under direct vision. Throughout the          procedure, the patient's blood pressure, pulse, and                            oxygen saturations were monitored continuously. The                            EC38-[id number]) scope was introduced through                            the anus and advanced to the the cecum, identified                            by appendiceal orifice and ileocecal valve. The                            entire colon was well visualized. The entire colon                            was well visualized. The quality of the bowel                            preparation was adequate. The quality of the bowel                            preparation was adequate. The entire colon was well                            visualized. The ileocecal valve, appendiceal                            orifice, and rectum were photographed. Scope In: 8:21:52 AM Scope Out: 8:37:43 AM Scope Withdrawal Time: 0 hours 11 minutes 36 seconds  Total Procedure Duration: 0 hours 15 minutes 51 seconds  Findings:      The perianal and digital rectal examinations were normal.      Scattered small and large-mouthed diverticula were found in the sigmoid       colon and descending colon.      A 4 mm polyp was found in the descending colon. The polyp was       semi-pedunculated. The polyp was removed with a cold snare. Resection  and retrieval were complete. Estimated blood loss was minimal.      The exam was otherwise without abnormality on direct and retroflexion       views. Impression:               - Diverticulosis in the sigmoid colon and in the                            descending colon.                           - One 4 mm polyp in the descending colon, removed                            with a cold snare. Resected and retrieved.                           - The examination was otherwise normal on direct                            and retroflexion views. Moderate Sedation:      Moderate (conscious) sedation was administered by  the endoscopy nurse       and supervised by the endoscopist. The following parameters were       monitored: oxygen saturation, heart rate, blood pressure, respiratory       rate, EKG, adequacy of pulmonary ventilation, and response to care.       Total physician intraservice time was 24 minutes. Recommendation:           - Patient has a contact number available for                            emergencies. The signs and symptoms of potential                            delayed complications were discussed with the                            patient. Return to normal activities tomorrow.                            Written discharge instructions were provided to the                            patient.                           - Resume previous diet.                           - Continue present medications.                           - Repeat colonoscopy date to be determined after                            pending pathology results are reviewed  for                            surveillance based on pathology results.                           - Return to GI office (date not yet determined). Procedure Code(s):        --- Professional ---                           [phone], Colonoscopy, flexible; with removal of                            tumor(s), polyp(s), or other lesion(s) by snare                            technique                           99152, Moderate sedation services provided by the                            same physician or other qualified health care                            professional performing the diagnostic or                            therapeutic service that the sedation supports,                            requiring the presence of an independent trained                            observer to assist in the monitoring of the                            patient's level of consciousness and physiological                            status; initial 15 minutes of intraservice  time,                            patient age 54 years or older                           [phone], Moderate sedation services; each additional                            15 minutes intraservice time Diagnosis Code(s):        --- Professional ---                           Z12.11, Encounter for screening for malignant  neoplasm of colon                           D12.4, Benign neoplasm of descending colon                           K57.30, Diverticulosis of large intestine without                            perforation or abscess without bleeding CPT copyright 2016 American Medical Association. All rights reserved. The codes documented in this report are preliminary and upon coder review may  be revised to meet current compliance requirements. Cristopher Estimable. Haelie Clapp, MD Norvel Richards, MD 06/29/2016 8:54:13 AM This report has been signed electronically. Number of Addenda: 0

## 2016-06-30 ENCOUNTER — Encounter (HOSPITAL_COMMUNITY): Payer: Self-pay | Admitting: Internal Medicine

## 2016-07-03 ENCOUNTER — Encounter: Payer: Self-pay | Admitting: Internal Medicine

## 2016-08-01 DIAGNOSIS — D485 Neoplasm of uncertain behavior of skin: Secondary | ICD-10-CM | POA: Diagnosis not present

## 2016-08-01 DIAGNOSIS — L859 Epidermal thickening, unspecified: Secondary | ICD-10-CM | POA: Diagnosis not present

## 2016-08-01 DIAGNOSIS — L309 Dermatitis, unspecified: Secondary | ICD-10-CM | POA: Diagnosis not present

## 2016-10-12 DIAGNOSIS — M5126 Other intervertebral disc displacement, lumbar region: Secondary | ICD-10-CM | POA: Diagnosis not present

## 2016-10-12 DIAGNOSIS — M5410 Radiculopathy, site unspecified: Secondary | ICD-10-CM | POA: Diagnosis not present

## 2016-10-16 DIAGNOSIS — M5136 Other intervertebral disc degeneration, lumbar region: Secondary | ICD-10-CM | POA: Diagnosis not present

## 2016-10-19 DIAGNOSIS — Z0001 Encounter for general adult medical examination with abnormal findings: Secondary | ICD-10-CM | POA: Diagnosis not present

## 2016-10-19 DIAGNOSIS — M5410 Radiculopathy, site unspecified: Secondary | ICD-10-CM | POA: Diagnosis not present

## 2016-10-19 DIAGNOSIS — R7309 Other abnormal glucose: Secondary | ICD-10-CM | POA: Diagnosis not present

## 2016-10-19 DIAGNOSIS — Z1389 Encounter for screening for other disorder: Secondary | ICD-10-CM | POA: Diagnosis not present

## 2016-10-19 DIAGNOSIS — M48061 Spinal stenosis, lumbar region without neurogenic claudication: Secondary | ICD-10-CM | POA: Diagnosis not present

## 2016-10-19 DIAGNOSIS — I10 Essential (primary) hypertension: Secondary | ICD-10-CM | POA: Diagnosis not present

## 2016-10-19 DIAGNOSIS — M5116 Intervertebral disc disorders with radiculopathy, lumbar region: Secondary | ICD-10-CM | POA: Diagnosis not present

## 2016-10-19 DIAGNOSIS — M4726 Other spondylosis with radiculopathy, lumbar region: Secondary | ICD-10-CM | POA: Diagnosis not present

## 2016-10-19 DIAGNOSIS — M5136 Other intervertebral disc degeneration, lumbar region: Secondary | ICD-10-CM | POA: Diagnosis not present

## 2016-11-01 DIAGNOSIS — M5126 Other intervertebral disc displacement, lumbar region: Secondary | ICD-10-CM | POA: Diagnosis not present

## 2016-11-01 DIAGNOSIS — M549 Dorsalgia, unspecified: Secondary | ICD-10-CM | POA: Diagnosis not present

## 2016-11-01 DIAGNOSIS — M5136 Other intervertebral disc degeneration, lumbar region: Secondary | ICD-10-CM | POA: Diagnosis not present

## 2016-11-01 DIAGNOSIS — M546 Pain in thoracic spine: Secondary | ICD-10-CM | POA: Diagnosis not present

## 2016-11-01 DIAGNOSIS — M47816 Spondylosis without myelopathy or radiculopathy, lumbar region: Secondary | ICD-10-CM | POA: Diagnosis not present

## 2016-11-30 DIAGNOSIS — Z01812 Encounter for preprocedural laboratory examination: Secondary | ICD-10-CM | POA: Diagnosis not present

## 2016-11-30 DIAGNOSIS — M5126 Other intervertebral disc displacement, lumbar region: Secondary | ICD-10-CM | POA: Diagnosis not present

## 2016-12-11 DIAGNOSIS — M5126 Other intervertebral disc displacement, lumbar region: Secondary | ICD-10-CM | POA: Diagnosis not present

## 2016-12-11 DIAGNOSIS — M5116 Intervertebral disc disorders with radiculopathy, lumbar region: Secondary | ICD-10-CM | POA: Diagnosis not present

## 2016-12-11 DIAGNOSIS — M4726 Other spondylosis with radiculopathy, lumbar region: Secondary | ICD-10-CM | POA: Diagnosis not present

## 2016-12-11 DIAGNOSIS — M5136 Other intervertebral disc degeneration, lumbar region: Secondary | ICD-10-CM | POA: Diagnosis not present

## 2017-02-21 DIAGNOSIS — M25552 Pain in left hip: Secondary | ICD-10-CM | POA: Diagnosis not present

## 2017-02-26 DIAGNOSIS — R102 Pelvic and perineal pain: Secondary | ICD-10-CM | POA: Diagnosis not present

## 2017-03-02 DIAGNOSIS — R102 Pelvic and perineal pain: Secondary | ICD-10-CM | POA: Diagnosis not present

## 2017-03-27 DIAGNOSIS — H31011 Macula scars of posterior pole (postinflammatory) (post-traumatic), right eye: Secondary | ICD-10-CM | POA: Diagnosis not present

## 2017-04-27 DIAGNOSIS — R29898 Other symptoms and signs involving the musculoskeletal system: Secondary | ICD-10-CM | POA: Diagnosis not present

## 2017-04-27 DIAGNOSIS — M5416 Radiculopathy, lumbar region: Secondary | ICD-10-CM | POA: Diagnosis not present

## 2017-04-27 DIAGNOSIS — Z9889 Other specified postprocedural states: Secondary | ICD-10-CM | POA: Diagnosis not present

## 2017-05-21 DIAGNOSIS — M47816 Spondylosis without myelopathy or radiculopathy, lumbar region: Secondary | ICD-10-CM | POA: Diagnosis not present

## 2017-05-24 DIAGNOSIS — M48061 Spinal stenosis, lumbar region without neurogenic claudication: Secondary | ICD-10-CM | POA: Diagnosis not present

## 2017-05-24 DIAGNOSIS — M47816 Spondylosis without myelopathy or radiculopathy, lumbar region: Secondary | ICD-10-CM | POA: Diagnosis not present

## 2017-05-24 DIAGNOSIS — M5126 Other intervertebral disc displacement, lumbar region: Secondary | ICD-10-CM | POA: Diagnosis not present

## 2017-06-04 DIAGNOSIS — M5136 Other intervertebral disc degeneration, lumbar region: Secondary | ICD-10-CM | POA: Diagnosis not present

## 2017-06-04 DIAGNOSIS — M47816 Spondylosis without myelopathy or radiculopathy, lumbar region: Secondary | ICD-10-CM | POA: Diagnosis not present

## 2017-06-04 DIAGNOSIS — M5416 Radiculopathy, lumbar region: Secondary | ICD-10-CM | POA: Diagnosis not present

## 2017-06-04 DIAGNOSIS — R29898 Other symptoms and signs involving the musculoskeletal system: Secondary | ICD-10-CM | POA: Diagnosis not present

## 2017-06-14 DIAGNOSIS — M48061 Spinal stenosis, lumbar region without neurogenic claudication: Secondary | ICD-10-CM | POA: Diagnosis not present

## 2017-06-14 DIAGNOSIS — M5136 Other intervertebral disc degeneration, lumbar region: Secondary | ICD-10-CM | POA: Diagnosis not present

## 2017-06-14 DIAGNOSIS — M4726 Other spondylosis with radiculopathy, lumbar region: Secondary | ICD-10-CM | POA: Diagnosis not present

## 2017-07-26 DIAGNOSIS — R002 Palpitations: Secondary | ICD-10-CM | POA: Diagnosis not present

## 2017-07-26 DIAGNOSIS — G4730 Sleep apnea, unspecified: Secondary | ICD-10-CM | POA: Diagnosis not present

## 2017-07-26 DIAGNOSIS — I499 Cardiac arrhythmia, unspecified: Secondary | ICD-10-CM | POA: Diagnosis not present

## 2017-08-09 ENCOUNTER — Telehealth: Payer: Self-pay | Admitting: Cardiology

## 2017-08-09 NOTE — Telephone Encounter (Signed)
Closed Encounter  °

## 2017-08-17 DIAGNOSIS — M5136 Other intervertebral disc degeneration, lumbar region: Secondary | ICD-10-CM | POA: Diagnosis not present

## 2017-08-17 DIAGNOSIS — M48062 Spinal stenosis, lumbar region with neurogenic claudication: Secondary | ICD-10-CM | POA: Diagnosis not present

## 2017-08-17 DIAGNOSIS — Z9889 Other specified postprocedural states: Secondary | ICD-10-CM | POA: Diagnosis not present

## 2017-08-27 DIAGNOSIS — G4730 Sleep apnea, unspecified: Secondary | ICD-10-CM | POA: Diagnosis not present

## 2017-08-28 DIAGNOSIS — G4730 Sleep apnea, unspecified: Secondary | ICD-10-CM | POA: Diagnosis not present

## 2017-08-30 ENCOUNTER — Ambulatory Visit: Payer: 59 | Admitting: Cardiology

## 2017-09-05 ENCOUNTER — Encounter: Payer: Self-pay | Admitting: Cardiology

## 2017-09-05 ENCOUNTER — Telehealth: Payer: Self-pay | Admitting: *Deleted

## 2017-09-05 NOTE — Progress Notes (Signed)
Cardiology Office Note   Date:  09/06/2017   ID:  Jose Oliver, DOB 26-Aug-1965, MRN [id number]  PCP:  Sharilyn Sites, MD  Cardiologist:   No primary care provider on file.   Chief Complaint  Patient presents with  . Palpitations      History of Present Illness: Jose Oliver is a 52 y.o. male who presents for evaluation of palpitations.  He is a self referral.  The patient has no past cardiac history.  He says that beginning palpitations for about a year.  He says it might have him sporadically at night.  He cannot bring it on.  He does not feel it during the day when he is busy.  He does not have any presyncope or syncope with this.  He is never had any other cardiac workup.  He denies any chest pressure, neck or arm discomfort.  He can ride a spin bike without bringing on symptoms.  He did see his primary provider.  Blood work is pending.  I do see a TSH from earlier this year that was normal.   Past Medical History:  Diagnosis Date  . Hypertension   . Palpitations     Past Surgical History:  Procedure Laterality Date  . BACK SURGERY    . COLONOSCOPY N/A 06/29/2016   Procedure: COLONOSCOPY;  Surgeon: Daneil Dolin, MD;  Location: [facility];  Service: Endoscopy;  Laterality: N/A;  8:30 AM  . KNEE SURGERY    . POLYPECTOMY  06/29/2016   Procedure: POLYPECTOMY;  Surgeon: Daneil Dolin, MD;  Location: [facility];  Service: Endoscopy;;  sigmoid colon     Current Outpatient Medications  Medication Sig Dispense Refill  . celecoxib (CELEBREX) 200 MG capsule Take 200 mg by mouth 2 (two) times daily.    Marland Kitchen losartan (COZAAR) 100 MG tablet Take 100 mg by mouth daily.    . Multiple Vitamins-Minerals (MULTIVITAMIN WITH MINERALS) tablet Take 1 tablet by mouth daily.    . Omega-3 Fatty Acids (FISH OIL) 1000 MG CAPS Take 1 capsule by mouth daily.     No current facility-administered medications for this visit.     Allergies:   Patient has no known allergies.    Social  History:  The patient  reports that he has never smoked. He has never used smokeless tobacco. He reports that he drinks alcohol. He reports that he does not use drugs.   Family History:  The patient's family history includes Arrhythmia in his son; Cancer in his father; Crohn's disease in his sister; Diverticulitis in his mother; Hypertension in his mother.    ROS:  Please see the history of present illness.   Otherwise, review of systems are positive for none.   All other systems are reviewed and negative.    PHYSICAL EXAM: VS:  BP 118/78 (BP Location: Left Arm)   Pulse 80   Ht 5\' 10"  (1.778 m)   Wt 219 lb 3.2 oz (99.4 kg)   BMI 31.45 kg/m  , BMI Body mass index is 31.45 kg/m. GENERAL:  Well appearing HEENT:  Pupils equal round and reactive, fundi not visualized, oral mucosa unremarkable NECK:  No jugular venous distention, waveform within normal limits, carotid upstroke brisk and symmetric, no bruits, no thyromegaly LYMPHATICS:  No cervical, inguinal adenopathy LUNGS:  Clear to auscultation bilaterally BACK:  No CVA tenderness CHEST:  Unremarkable HEART:  PMI not displaced or sustained,S1 and S2 within normal limits, no S3, no S4,  no clicks, no rubs, no murmurs ABD:  Flat, positive bowel sounds normal in frequency in pitch, no bruits, no rebound, no guarding, no midline pulsatile mass, no hepatomegaly, no splenomegaly EXT:  2 plus pulses throughout, no edema, no cyanosis no clubbing SKIN:  No rashes no nodules NEURO:  Cranial nerves II through XII grossly intact, motor grossly intact throughout PSYCH:  Cognitively intact, oriented to person place and time    EKG:  EKG is ordered today. The ekg ordered today demonstrates sinus rhythm, rate 80, axis within normal limits, intervals within normal limits, no acute ST-T wave changes.   Recent Labs: No results found for requested labs within last 8760 hours.    Lipid Panel No results found for: CHOL, TRIG, HDL, CHOLHDL, VLDL,  LDLCALC, LDLDIRECT    Wt Readings from Last 3 Encounters:  09/06/17 219 lb 3.2 oz (99.4 kg)  06/29/16 220 lb (99.8 kg)  01/30/14 214 lb (97.1 kg)      Other studies Reviewed: Additional studies/ records that were reviewed today include: labs. Review of the above records demonstrates:  Please see elsewhere in the note.     ASSESSMENT AND PLAN:  PALPITATIONS:    I suspect PACs or PVCs.  He has a normal exam.  He has no significant risk factors.  He did have a sister who died young but on further exploration she had Crohn's disease and had been ill with multiple viral problems and probably had dehydration.  He has a normal EKG.  He has no symptoms suggestive of structural heart disease.  I think further imaging would be unlikely to be helpful.  He does not want symptomatic treatment.  He will follow-up the blood work done recently by his primary MD.  No change in therapy is indicated.   Current medicines are reviewed at length with the patient today.  The patient does not have concerns regarding medicines.  The following changes have been made:  no change  Labs/ tests ordered today include: None  Orders Placed This Encounter  Procedures  . EKG 12-Lead     Disposition:   FU with me as needed.      Signed, Minus Breeding, MD  09/06/2017 5:24 PM    [place]

## 2017-09-05 NOTE — Telephone Encounter (Signed)
NOTES FAXED TO [facility] FROM [facility].

## 2017-09-06 ENCOUNTER — Ambulatory Visit: Payer: 59 | Admitting: Cardiology

## 2017-09-06 ENCOUNTER — Encounter: Payer: Self-pay | Admitting: Cardiology

## 2017-09-06 VITALS — BP 118/78 | HR 80 | Ht 70.0 in | Wt 219.2 lb

## 2017-09-06 DIAGNOSIS — R002 Palpitations: Secondary | ICD-10-CM | POA: Diagnosis not present

## 2017-09-06 DIAGNOSIS — Z0000 Encounter for general adult medical examination without abnormal findings: Secondary | ICD-10-CM | POA: Diagnosis not present

## 2017-09-06 DIAGNOSIS — Z1389 Encounter for screening for other disorder: Secondary | ICD-10-CM | POA: Diagnosis not present

## 2017-09-06 DIAGNOSIS — E6609 Other obesity due to excess calories: Secondary | ICD-10-CM | POA: Diagnosis not present

## 2017-09-06 DIAGNOSIS — I10 Essential (primary) hypertension: Secondary | ICD-10-CM | POA: Diagnosis not present

## 2017-09-06 DIAGNOSIS — R7309 Other abnormal glucose: Secondary | ICD-10-CM | POA: Diagnosis not present

## 2017-09-06 NOTE — Patient Instructions (Signed)
Medication Instructions:  Continue current medications  If you need a refill on your cardiac medications before your next appointment, please call your pharmacy.  Labwork: None Ordered  Testing/Procedures: None Ordered  Follow-Up: Your physician wants you to follow-up in: As Needed.      Thank you for choosing [facility]!!       

## 2017-09-18 DIAGNOSIS — G4733 Obstructive sleep apnea (adult) (pediatric): Secondary | ICD-10-CM | POA: Diagnosis not present

## 2017-12-26 DIAGNOSIS — H5201 Hypermetropia, right eye: Secondary | ICD-10-CM | POA: Diagnosis not present

## 2020-02-06 ENCOUNTER — Other Ambulatory Visit: Payer: Self-pay | Admitting: Family Medicine

## 2020-02-06 ENCOUNTER — Other Ambulatory Visit (HOSPITAL_COMMUNITY): Payer: Self-pay | Admitting: Family Medicine

## 2020-02-06 DIAGNOSIS — M5030 Other cervical disc degeneration, unspecified cervical region: Secondary | ICD-10-CM

## 2020-02-06 DIAGNOSIS — M5412 Radiculopathy, cervical region: Secondary | ICD-10-CM

## 2020-02-27 ENCOUNTER — Ambulatory Visit (HOSPITAL_COMMUNITY)
Admission: RE | Admit: 2020-02-27 | Discharge: 2020-02-27 | Disposition: A | Discharge status: Home / Self Care | Payer: 59 | Source: Ambulatory Visit | Attending: Family Medicine | Admitting: Family Medicine

## 2020-02-27 ENCOUNTER — Other Ambulatory Visit: Payer: Self-pay

## 2020-02-27 DIAGNOSIS — M5030 Other cervical disc degeneration, unspecified cervical region: Secondary | ICD-10-CM | POA: Insufficient documentation

## 2020-02-27 DIAGNOSIS — M5412 Radiculopathy, cervical region: Secondary | ICD-10-CM | POA: Insufficient documentation

## 2020-05-31 ENCOUNTER — Ambulatory Visit (HOSPITAL_COMMUNITY): Payer: 59 | Attending: Student | Admitting: Physical Therapy

## 2020-05-31 ENCOUNTER — Encounter (HOSPITAL_COMMUNITY): Payer: Self-pay | Admitting: Physical Therapy

## 2020-05-31 ENCOUNTER — Other Ambulatory Visit: Payer: Self-pay

## 2020-05-31 DIAGNOSIS — M5412 Radiculopathy, cervical region: Secondary | ICD-10-CM | POA: Insufficient documentation

## 2020-05-31 DIAGNOSIS — R293 Abnormal posture: Secondary | ICD-10-CM | POA: Diagnosis present

## 2020-05-31 NOTE — Therapy (Signed)
[facility] [address] [place], [place], [address] Phone: [phone]   Fax:  [phone]  Physical Therapy Evaluation  Patient Details  Name: GISCARD NOUN MRN: [id number] Date of Birth: [birth year] Referring Provider (PT): Viona Gilmore NP   Encounter Date: 05/31/2020   PT End of Session - 05/31/20 1510    Visit Number 1    Number of Visits 8    Date for PT Re-Evaluation 07/02/20    Authorization Type [employer] (Needs auth after 60 visits)    PT Start Time 1350    PT Stop Time 1450    PT Time Calculation (min) 60 min    Activity Tolerance Patient tolerated treatment well    Behavior During Therapy [facility] for tasks assessed/performed           Past Medical History:  Diagnosis Date  . Hypertension   . Palpitations     Past Surgical History:  Procedure Laterality Date  . BACK SURGERY    . COLONOSCOPY N/A 06/29/2016   Procedure: COLONOSCOPY;  Surgeon: Daneil Dolin, MD;  [facility];  Service: Endoscopy;  Laterality: N/A;  8:30 AM  . KNEE SURGERY    . POLYPECTOMY  06/29/2016   Procedure: POLYPECTOMY;  Surgeon: Daneil Dolin, MD;  [facility];  Service: Endoscopy;;  sigmoid colon    There were no vitals filed for this visit.    Subjective Assessment - 05/31/20 1355    Subjective Patient presents to physical therapy with complaint of neck stiffness. Patient says this began about 1 year ago insidiously. Patient says he had recent MRI which showed some degeneration in discs. He says he doesn't really have pain just stiffness and can't move his neck well. He says he would like to try therapy before having surgery. He says he does get some tingling in his RT shoulder area sometimes. Feels that looking up and to the right is worse. Feels his work causes him to do this while looking through his reading glasses. Reports history of knee pain and low back pain, says he takes Celebrex PRN but notes no  additional medication for neck stiffness.    Limitations House hold activities;Reading    Diagnostic tests MRI    Patient Stated Goals Get rid of stiffness in neck muscles    Currently in Pain? No/denies   no pain at rest, looking straight ahead             [facility] PT Assessment - 05/31/20 0001      Assessment   Medical Diagnosis Cervical radiculopathy    Referring Provider (PT) Viona Gilmore NP    Onset Date/Surgical Date --   chronic > 1 year   Prior Therapy Yes for knee      Precautions   Precautions None      Restrictions   Weight Bearing Restrictions No      Balance Screen   Has the patient fallen in the past 6 months No      [place] residence      Prior Function   Level of Independence Independent    Vocation Full time employment    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)      Cognition   Overall Cognitive Status Within Functional Limits for tasks assessed      Posture/Postural Control   Posture/Postural Control Postural limitations    Postural Limitations Rounded  Shoulders;Forward head;Decreased thoracic kyphosis      ROM / Strength   AROM / PROM / Strength AROM;Strength      AROM   Overall AROM Comments Bilateral shoulder AROM WNL    AROM Assessment Site Cervical    Cervical Flexion 50    Cervical Extension 45    Cervical - Right Side Bend 16    Cervical - Left Side Bend 10    Cervical - Right Rotation 54    Cervical - Left Rotation 64      Strength   Overall Strength Comments BIlateral UE strength WNL      Flexibility   Soft Tissue Assessment /Muscle Length --   Mod/max restriction in bilateral upper trapezius flexibility     Palpation   Palpation comment Mod TTP about RT upper trapezius and LT levator                      Objective measurements completed on examination: See above findings.       [facility] Adult PT Treatment/Exercise - 05/31/20 0001      Exercises    Exercises Neck      Neck Exercises: Seated   Neck Retraction 10 reps;3 secs    Other Seated Exercise scapular retraction 10 x 5"      Neck Exercises: Stretches   Upper Trapezius Stretch Right;Left;2 reps;30 seconds            Trigger Point Dry Needling - 05/31/20 0001    Consent Given? Yes    Education Handout Provided Yes    Muscles Treated Head and Neck Upper trapezius    Dry Needling Comments single needle to RT upper trap with pistoning, patient in prone, tolerated well    Upper Trapezius Response Twitch reponse elicited;Palpable increased muscle length                PT Education - 05/31/20 1359    Education Details on evaluation findings, POC and HEP    Person(s) Educated Patient    Methods Explanation;Handout    Comprehension Verbalized understanding            PT Short Term Goals - 05/31/20 1519      PT SHORT TERM GOAL #1   Title Patient will be independent with initial HEP and self-management strategies to improve functional outcomes    Time 2    Period Weeks    Status New    Target Date 06/18/20             PT Long Term Goals - 05/31/20 1519      PT LONG TERM GOAL #1   Title Patient will report at least 75% overall improvement in subjective complaint to indicate improvement in ability to perform ADLs.    Time 4    Period Weeks    Status New    Target Date 07/02/20      PT LONG TERM GOAL #2   Title Patient improve RT cervical rotation by 10 degrees in order to improve ability to scan environment for safety and while driving.    Time 4    Period Weeks    Status New    Target Date 07/02/20      PT LONG TERM GOAL #3   Title Patient will be independent with final HEP and self-management strategies to improve functional outcomes    Time 4    Period Weeks    Status New    Target Date 07/02/20  Plan - 05/31/20 1514    Clinical Impression Statement Patient is a 55 y.o. male who presents to physical therapy with  complaint of cervical radiculopathy. Patient demonstrates ROM restriction, reduced flexibility, increased tenderness to palpation and postural abnormalities which are likely contributing to symptoms of pain and are negatively impacting patient ability to perform ADLs. Patient will benefit from skilled physical therapy services to address these deficits to reduce pain and improve level of function with ADLs    Personal Factors and Comorbidities Time since onset of injury/illness/exacerbation    Examination-Activity Limitations Sleep;Sit;Other    Examination-Participation Restrictions Occupation    Stability/Clinical Decision Making Stable/Uncomplicated    Clinical Decision Making Low    Rehab Potential Good    PT Frequency 2x / week    PT Duration 4 weeks    PT Treatment/Interventions ADLs/Self Care Home Management;Aquatic Therapy;Biofeedback;Cryotherapy;Electrical Stimulation;Iontophoresis 4mg /ml Dexamethasone;Moist Heat;Traction;Balance training;Vasopneumatic Device;Manual techniques;Therapeutic activities;Therapeutic exercise;Gait training;Stair training;Functional mobility training;Taping;Splinting;Energy conservation;Joint Manipulations;Dry needling;Passive range of motion;Spinal Manipulations;Visual/perceptual remediation/compensation;Compression bandaging;Parrafin;Ultrasound;Fluidtherapy;Neuromuscular re-education;Contrast Bath;DME Instruction;Cognitive remediation;Scar mobilization;Patient/family education;Orthotic Fit/Training    PT Next Visit Plan Review goals, HEP and assess reponse to dry needling. Progress postural strength and cervical/ thoracic mobility as tolerated.    PT Home Exercise Plan Eval: chin tuck, scapular retraction, upper trap stretch    Consulted and Agree with Plan of Care Patient           Patient will benefit from skilled therapeutic intervention in order to improve the following deficits and impairments:  Postural dysfunction,Impaired flexibility,Decreased range of  motion,Hypomobility,Pain,Increased fascial restricitons  Visit Diagnosis: Radiculopathy, cervical region  Abnormal posture     Problem List There are no problems to display for this patient.   3:30 PM, 05/31/20 Josue Hector PT DPT  Physical Therapist with [facility]  [phone] [place] [address] [place], [place], [address] Phone: [phone]   Fax:  [phone]  Name: KEILEN MAYNER MRN: [id number] Date of Birth: Aug 21, 1965

## 2020-05-31 NOTE — Patient Instructions (Addendum)
Trigger Point Dry Needling  . What is Trigger Point Dry Needling (DN)? o DN is a physical therapy technique used to treat muscle pain and dysfunction. Specifically, DN helps deactivate muscle trigger points (muscle knots).  o A thin filiform needle is used to penetrate the skin and stimulate the underlying trigger point. The goal is for a local twitch response (LTR) to occur and for the trigger point to relax. No medication of any kind is injected during the procedure.   . What Does Trigger Point Dry Needling Feel Like?  o The procedure feels different for each individual patient. Some patients report that they do not actually feel the needle enter the skin and overall the process is not painful. Very mild bleeding may occur. However, many patients feel a deep cramping in the muscle in which the needle was inserted. This is the local twitch response.   Marland Kitchen How Will I feel after the treatment? o Soreness is normal, and the onset of soreness may not occur for a few hours. Typically this soreness does not last longer than two days.  o Bruising is uncommon, however; ice can be used to decrease any possible bruising.  o In rare cases feeling tired or nauseous after the treatment is normal. In addition, your symptoms may get worse before they get better, this period will typically not last longer than 24 hours.   . What Can I do After My Treatment? o Increase your hydration by drinking more water for the next 24 hours. o You may place ice or heat on the areas treated that have become sore, however, do not use heat on inflamed or bruised areas. Heat often brings more relief post needling. o You can continue your regular activities, but vigorous activity is not recommended initially after the treatment for 24 hours. o DN is best combined with other physical therapy such as strengthening, stretching, and other therapies.   Access Code: [email or web address] URL: [email or web address] Date:  05/31/2020 Prepared by: Georges Lynch  Exercises Seated Scapular Retraction - 3 x daily - 7 x weekly - 2 sets - 10 reps - 5 seconds hold Seated Cervical Retraction - 3 x daily - 7 x weekly - 2 sets - 10 reps - 3-5 seconds hold Seated Cervical Sidebending Stretch - 3 x daily - 7 x weekly - 1 sets - 3 reps - 20-30 seconds hold

## 2020-06-03 ENCOUNTER — Ambulatory Visit (HOSPITAL_COMMUNITY): Payer: 59 | Admitting: Physical Therapy

## 2020-06-07 ENCOUNTER — Telehealth (HOSPITAL_COMMUNITY): Payer: Self-pay | Admitting: Physical Therapy

## 2020-06-07 NOTE — Telephone Encounter (Signed)
pt cancelled appt for 01/12 because he had a COVID exposure

## 2020-06-09 ENCOUNTER — Ambulatory Visit (HOSPITAL_COMMUNITY): Payer: 59 | Admitting: Physical Therapy

## 2020-06-16 ENCOUNTER — Ambulatory Visit (HOSPITAL_COMMUNITY): Payer: 59 | Admitting: Physical Therapy

## 2020-06-18 ENCOUNTER — Ambulatory Visit (HOSPITAL_COMMUNITY): Payer: 59 | Admitting: Physical Therapy

## 2020-06-18 ENCOUNTER — Other Ambulatory Visit: Payer: Self-pay

## 2020-06-18 ENCOUNTER — Encounter (HOSPITAL_COMMUNITY): Payer: Self-pay | Admitting: Physical Therapy

## 2020-06-18 DIAGNOSIS — M5412 Radiculopathy, cervical region: Secondary | ICD-10-CM

## 2020-06-18 DIAGNOSIS — R293 Abnormal posture: Secondary | ICD-10-CM

## 2020-06-18 NOTE — Therapy (Addendum)
[facility] [address] [place], [place], [address] Phone: [phone]   Fax:  [phone]  Physical Therapy Treatment and Discharge Note  Patient Details  Name: Jose Oliver MRN: [id number] Date of Birth: [birth year] Referring Provider (PT): Viona Gilmore NP   PHYSICAL THERAPY DISCHARGE SUMMARY  Visits from Start of Care: 2  Current functional level related to goals / functional outcomes: Unable to assess due to unplanned discharge   Remaining deficits: Unable to assess due to unplanned discharge   Education / Equipment: Unable to assess due to unplanned discharge   Plan: Patient agrees to discharge.  Patient goals were not met. Patient is being discharged due to not returning since the last visit.  ?????           Encounter Date: 06/18/2020   PT End of Session - 06/18/20 9233    Visit Number 2    Number of Visits 8    Date for PT Re-Evaluation 07/02/20    Authorization Type [employer] (Needs auth after 60 visits)    PT Start Time 0830    PT Stop Time 0910    PT Time Calculation (min) 40 min    Activity Tolerance Patient tolerated treatment well    Behavior During Therapy [facility] for tasks assessed/performed           Past Medical History:  Diagnosis Date  . Hypertension   . Palpitations     Past Surgical History:  Procedure Laterality Date  . BACK SURGERY    . COLONOSCOPY N/A 06/29/2016   Procedure: COLONOSCOPY;  Surgeon: Daneil Dolin, MD;  [facility];  Service: Endoscopy;  Laterality: N/A;  8:30 AM  . KNEE SURGERY    . POLYPECTOMY  06/29/2016   Procedure: POLYPECTOMY;  Surgeon: Daneil Dolin, MD;  [facility];  Service: Endoscopy;;  sigmoid colon    There were no vitals filed for this visit.   Subjective Assessment - 06/18/20 0832    Subjective Patient reports that the family had covid and he has been out. He reports some stiffness and no pain this morning. Reports that  after being needled it was about 10x better. Reports that now the pain isnt constant after being needled.    Limitations House hold activities;Reading    Diagnostic tests MRI    Patient Stated Goals Get rid of stiffness in neck muscles    Currently in Pain? No/denies              [facility] PT Assessment - 06/18/20 0001      Assessment   Medical Diagnosis Cervical radiculopathy    Referring Provider (PT) Viona Gilmore NP                         [facility] Adult PT Treatment/Exercise - 06/18/20 0001      Neck Exercises: Seated   Lateral Flexion Both   trap stretch x30"   Postural Training posture adjustment 10x10" holds   cues for head alignment, rolling shoulders back, and scapular retraction.   Other Seated Exercise posture adjustment 10x10" holds      Neck Exercises: Supine   Capital Flexion 10 reps;10 secs;5 secs   2 sets   Other Supine Exercise Shoulder protraction 2x15    Other Supine Exercise Cervical side flexion x15      Manual Therapy   Manual Therapy Joint mobilization;Soft tissue mobilization    Manual therapy comments  completed separate from other therapy    Joint Mobilization cervical mobilizations - side glides, UAP and CPAs grade II/III with and without cervical ROM - focused on Right side but performed bilaterally - greatest restrictions from C4-6    Soft tissue mobilization STM to R Upper trap, bilateral suboccipitals - tolerated well,            Trigger Point Dry Needling - 06/18/20 0001    Consent Given? Yes    Education Handout Provided Previously provided    Muscles Treated Head and Neck Upper trapezius    Dry Needling Comments single needle to left UT and 2 needles to right upper trap    Upper Trapezius Response Twitch reponse elicited;Palpable increased muscle length                PT Education - 06/18/20 0850    Education Details dry needling precautions, sleep positioning, posture/work setup, cervical mobility/anatomy.     Person(s) Educated Patient    Methods Explanation    Comprehension Verbalized understanding            PT Short Term Goals - 06/18/20 0828      PT SHORT TERM GOAL #1   Title Patient will be independent with initial HEP and self-management strategies to improve functional outcomes    Time 2    Period Weeks    Status On-going    Target Date 06/18/20             PT Long Term Goals - 06/18/20 9983      PT LONG TERM GOAL #1   Title Patient will report at least 75% overall improvement in subjective complaint to indicate improvement in ability to perform ADLs.    Time 4    Period Weeks    Status On-going      PT LONG TERM GOAL #2   Title Patient improve RT cervical rotation by 10 degrees in order to improve ability to scan environment for safety and while driving.    Time 4    Period Weeks    Status On-going      PT LONG TERM GOAL #3   Title Patient will be independent with final HEP and self-management strategies to improve functional outcomes    Time 4    Period Weeks    Status On-going                 Plan - 06/18/20 3825    Clinical Impression Statement Focused on continued progression of postural exercises. Dry needled patient with good response afterwards with notable reduction in stiffness noted afterwards. Added cervical mobilizations which were tolerated well. Patient stuck in upper cervical extension and lower cervical flexion. Will continue to work on improving mobility in opposite directions. Will assess thoracic mobility in future and work on scapular movements and will continue to dry needle as appropriate.    Personal Factors and Comorbidities Time since onset of injury/illness/exacerbation    Examination-Activity Limitations Sleep;Sit;Other    Examination-Participation Restrictions Occupation    Stability/Clinical Decision Making Stable/Uncomplicated    Rehab Potential Good    PT Frequency 2x / week    PT Duration 4 weeks    PT  Treatment/Interventions ADLs/Self Care Home Management;Aquatic Therapy;Biofeedback;Cryotherapy;Electrical Stimulation;Iontophoresis 79m/ml Dexamethasone;Moist Heat;Traction;Balance training;Vasopneumatic Device;Manual techniques;Therapeutic activities;Therapeutic exercise;Gait training;Stair training;Functional mobility training;Taping;Splinting;Energy conservation;Joint Manipulations;Dry needling;Passive range of motion;Spinal Manipulations;Visual/perceptual remediation/compensation;Compression bandaging;Parrafin;Ultrasound;Fluidtherapy;Neuromuscular re-education;Contrast Bath;DME Instruction;Cognitive remediation;Scar mobilization;Patient/family education;Orthotic Fit/Training    PT Next Visit Plan thoracic mobility, rib/tspine mobs, scapular mobility (protraction/upward  rotation), dry needle as able.    PT Home Exercise Plan Eval: chin tuck, scapular retraction, upper trap stretch. 1/21: supine cervical side flexion, protraction, chin tuck with verbal cues of bobble head.    Consulted and Agree with Plan of Care Patient           Patient will benefit from skilled therapeutic intervention in order to improve the following deficits and impairments:  Postural dysfunction,Impaired flexibility,Decreased range of motion,Hypomobility,Pain,Increased fascial restricitons  Visit Diagnosis: Radiculopathy, cervical region  Abnormal posture     Problem List There are no problems to display for this patient.  9:37 AM, 06/18/20 Jerene Pitch, DPT Physical Therapy with [facility]  [phone] office  [facility] [address] [place], [place], [address] Phone: [phone]   Fax:  [phone]  Name: Jose Oliver MRN: [id number] Date of Birth: [birth year]

## 2020-06-21 ENCOUNTER — Telehealth (HOSPITAL_COMMUNITY): Payer: Self-pay | Admitting: Physical Therapy

## 2020-06-21 NOTE — Telephone Encounter (Signed)
Pt has to work and he did not want to r/s he is too busy right now.

## 2020-06-23 ENCOUNTER — Encounter (HOSPITAL_COMMUNITY): Payer: 59 | Admitting: Physical Therapy

## 2020-07-06 ENCOUNTER — Telehealth (HOSPITAL_COMMUNITY): Payer: Self-pay | Admitting: Physical Therapy

## 2020-07-06 NOTE — Telephone Encounter (Signed)
Pt requested to be d/c - DN2 is not helping

## 2020-07-07 ENCOUNTER — Ambulatory Visit (HOSPITAL_COMMUNITY): Payer: 59 | Admitting: Physical Therapy

## 2021-02-02 NOTE — ED Provider Notes (Signed)
-------------------------------------------------------------------------------   Attestation signed by Moody Mar, DO at 02/04/21 0222 Patient was seen by PA or NP and I was not involved in patient's work-up, care plan, treatment or disposition.  I was available for consultation at all points during patient's visit.  -------------------------------------------------------------------------------  Emergency Department Provider Note    ED Clinical Impression   Final diagnoses:  Laceration of left upper extremity, initial encounter (Primary)    ED Assessment/Plan    History   Chief Complaint  Patient presents with  . Wrist Laceration   55 year old gentleman with a 4 cm C shaped laceration on the dorsum of the left wrist distally.   Cut it on a trailer hitch of his vehicle earlier tonight.  Bled briskly for a short while, bleeding has now stopped.  Not current on his tetanus.  Here to get the wound cleaned and closed and his tetanus updated.     Past Medical History:  Diagnosis Date  . Hypertension     No past surgical history on file.  No family history on file.  Social History   Socioeconomic History  . Marital status: Married    Review of Systems  Skin: Positive for wound.  All other systems reviewed and are negative.   Physical Exam   BP 135/80   Pulse 99   Temp 36.6 C (97.9 F) (Oral)   Resp 18   Ht 180.3 cm (5' 11)   Wt 95.3 kg (210 lb)   SpO2 96%   BMI 29.29 kg/m   Physical Exam Vitals and nursing note reviewed.  Cardiovascular:     Rate and Rhythm: Regular rhythm.  Pulmonary:     Breath sounds: Normal breath sounds.  Musculoskeletal:        General: Signs of injury present. No swelling, tenderness or deformity. Normal range of motion.  Skin:    General: Skin is warm.     Capillary Refill: Capillary refill takes less than 2 seconds.     Comments: 4 cm C-shaped laceration over the dorsum of the left wrist distally.  It is superficial.   No tendons exposed.  All tendon function is intact to wrist hand and fingers.  Neurological:     General: No focal deficit present.  Psychiatric:        Mood and Affect: Mood normal.     ED Course    Procedure note:    Site cleaned w/ betadine then flushed copiously with normal saline x100 ml.   1% plain lidocaine  infiltrated w/ 4cc. After satisfactory numbing,   Closed with 7 small surgical staples.    Wound closed well. Xeroform gauze, kerlex and coban applied.   Tolerated well. Wound dressing change in 2 days discussed w/ pt.   Staples out in 7 days.   Coding   Sherwood Gwenn Mace, [place] 02/02/21 2242

## 2022-06-22 ENCOUNTER — Encounter: Payer: Self-pay | Admitting: Cardiology

## 2022-06-22 ENCOUNTER — Ambulatory Visit: Payer: 59 | Attending: Cardiology | Admitting: Cardiology

## 2022-06-22 VITALS — BP 132/84 | HR 71 | Ht 70.0 in | Wt 222.8 lb

## 2022-06-22 DIAGNOSIS — R002 Palpitations: Secondary | ICD-10-CM | POA: Diagnosis not present

## 2022-06-22 MED ORDER — AMLODIPINE BESYLATE 2.5 MG PO TABS: 2.5000 mg | ORAL_TABLET | Freq: Every day | ORAL | 3 refills | Status: AC

## 2022-06-22 NOTE — Patient Instructions (Addendum)
Medication Instructions:  Start Amlodipine 2.5 mg daily.   *If you need a refill on your cardiac medications before your next appointment, please call your pharmacy*   Lab Work: NONE ordered at this time of appointment   If you have labs (blood work) drawn today and your tests are completely normal, you will receive your results only by: [place] (if you have MyChart) OR A paper copy in the mail If you have any lab test that is abnormal or we need to change your treatment, we will call you to review the results.   Testing/Procedures: Calcium Score Dorita Fray)    Follow-Up: At [facility], you and your health needs are our priority.  As part of our continuing mission to provide you with exceptional heart care, we have created designated Provider Care Teams.  These Care Teams include your primary Cardiologist (physician) and Advanced Practice Providers (APPs -  Physician Assistants and Nurse Practitioners) who all work together to provide you with the care you need, when you need it.  We recommend signing up for the patient portal called "MyChart".  Sign up information is provided on this After Visit Summary.  MyChart is used to connect with patients for Virtual Visits (Telemedicine).  Patients are able to view lab/test results, encounter notes, upcoming appointments, etc.  Non-urgent messages can be sent to your provider as well.   To learn more about what you can do with MyChart, go to [email or web address].    Your next appointment:    As needed   Provider:   APP or Dr. Percival Spanish     Other Instructions Calcium Scoring [facility])

## 2022-06-22 NOTE — Progress Notes (Addendum)
.    Cardiology Office Note   Date:  06/22/2022   ID:  Jose Oliver, DOB Sep 29, 1965, MRN [id number]  PCP:  Sharilyn Sites, MD  Cardiologist:   None Referring:  Self  Chief Complaint  Patient presents with   Hypertension      History of Present Illness: Jose Oliver is a 57 y.o. male who presents for evaluation of difficult to control hypertension.  I saw him in 2019 for evaluation of palpitations.  He returned because his blood pressures been elevated.  He had a ruptured vessel in his eye and this worried him.  His blood pressures in the 599J systolic not infrequently.  Diastolics have been as high as the 90s.  He is trying to be active.  He does go to the gym and lifts weights.  He gets on the elliptical.  He denies any chest pressure, neck or arm discomfort.  He had no new shortness of breath, PND or orthopnea.  He had no palpitations, presyncope or syncope.  He had no weight gain or edema.   Past Medical History:  Diagnosis Date   Hypertension    Palpitations     Past Surgical History:  Procedure Laterality Date   BACK SURGERY     COLONOSCOPY N/A 06/29/2016   Procedure: COLONOSCOPY;  Surgeon: Jose Dolin, MD;  Location: [facility];  Service: Endoscopy;  Laterality: N/A;  8:30 AM   KNEE SURGERY     POLYPECTOMY  06/29/2016   Procedure: POLYPECTOMY;  Surgeon: Jose Dolin, MD;  Location: [facility];  Service: Endoscopy;;  sigmoid colon     Current Outpatient Medications  Medication Sig Dispense Refill   amLODipine (NORVASC) 2.5 MG tablet Take 1 tablet (2.5 mg total) by mouth daily. 90 tablet 3   celecoxib (CELEBREX) 200 MG capsule Take 200 mg by mouth 2 (two) times daily.     Multiple Vitamins-Minerals (MULTIVITAMIN WITH MINERALS) tablet Take 1 tablet by mouth daily.     olmesartan (BENICAR) 40 MG tablet Take 40 mg by mouth daily.     Omega-3 Fatty Acids (FISH OIL) 1000 MG CAPS Take 1 capsule by mouth daily.     No current facility-administered medications for  this visit.    Allergies:   Patient has no known allergies.    Social History:  The patient  reports that he has never smoked. He has never used smokeless tobacco. He reports current alcohol use. He reports that he does not use drugs.   Family History:  The patient's family history includes Arrhythmia in his son; Cancer in his father; Crohn's disease in his sister; Diverticulitis in his mother; Hypertension in his mother.    ROS:  Please see the history of present illness.   Otherwise, review of systems are positive for none.   All other systems are reviewed and negative.    PHYSICAL EXAM: VS:  BP 132/84 (BP Location: Left Arm, Patient Position: Sitting, Cuff Size: Normal)   Pulse 71   Ht '5\' 10"'$  (1.778 m)   Wt 222 lb 12.8 oz (101.1 kg)   SpO2 95%   BMI 31.97 kg/m  , BMI Body mass index is 31.97 kg/m. GENERAL:  Well appearing HEENT:  Pupils equal round and reactive, fundi not visualized, oral mucosa unremarkable NECK:  No jugular venous distention, waveform within normal limits, carotid upstroke brisk and symmetric, no bruits, no thyromegaly LYMPHATICS:  No cervical, inguinal adenopathy LUNGS:  Clear to auscultation bilaterally BACK:  No  CVA tenderness CHEST:  Unremarkable HEART:  PMI not displaced or sustained,S1 and S2 within normal limits, no S3, no S4, no clicks, no rubs, no murmurs ABD:  Flat, positive bowel sounds normal in frequency in pitch, no bruits, no rebound, no guarding, no midline pulsatile mass, no hepatomegaly, no splenomegaly EXT:  2 plus pulses throughout, no edema, no cyanosis no clubbing SKIN:  No rashes no nodules NEURO:  Cranial nerves II through XII grossly intact, motor grossly intact throughout PSYCH:  Cognitively intact, oriented to person place and time    EKG:  EKG is ordered today. The ekg ordered today demonstrates sinus rhythm, rate 71, axis within normal limits, intervals within normal limits, no acute ST-T wave changes   Recent Labs: No  results found for requested labs within last 365 days.    Lipid Panel No results found for: "CHOL", "TRIG", "HDL", "CHOLHDL", "VLDL", "LDLCALC", "LDLDIRECT"    Wt Readings from Last 3 Encounters:  06/22/22 222 lb 12.8 oz (101.1 kg)  09/06/17 219 lb 3.2 oz (99.4 kg)  06/29/16 220 lb (99.8 kg)      Other studies Reviewed: Additional studies/ records that were reviewed today include: Labs. Review of the above records demonstrates:  Please see elsewhere in the note.     ASSESSMENT AND PLAN:  HTN: The blood pressure is not at target.  I am going to add amlodipine 2.5 mg daily.  He will keep a blood pressure diary.  Palpitations: He is not bothered by these.  No change in therapy.  Dyslipidemia: LDL 112 with an HDL of 52.  I would like to screen him with a coronary calcium score.   Current medicines are reviewed at length with the patient today.  The patient does not have concerns regarding medicines.  The following changes have been made:  no change  Labs/ tests ordered today include:     Orders Placed This Encounter  Procedures   CT CARDIAC SCORING     Disposition:   FU with me in 12 months.     Signed, Rollene Rotunda, MD  06/22/2022 9:09 AM    [facility]

## 2022-06-26 NOTE — Addendum Note (Signed)
Addended by: Brantley Persons A on: 06/26/2022 04:37 PM   Modules accepted: Orders

## 2022-07-10 ENCOUNTER — Ambulatory Visit: Payer: 59 | Admitting: Cardiology

## 2022-08-08 ENCOUNTER — Other Ambulatory Visit (HOSPITAL_COMMUNITY): Payer: 59

## 2022-09-29 ENCOUNTER — Ambulatory Visit (HOSPITAL_COMMUNITY)
Admission: RE | Admit: 2022-09-29 | Discharge: 2022-09-29 | Disposition: A | Discharge status: Home / Self Care | Payer: 59 | Source: Ambulatory Visit | Attending: Cardiology | Admitting: Cardiology

## 2022-09-29 DIAGNOSIS — R002 Palpitations: Secondary | ICD-10-CM | POA: Insufficient documentation

## 2022-10-02 ENCOUNTER — Telehealth: Payer: Self-pay | Admitting: *Deleted

## 2022-10-02 DIAGNOSIS — I2510 Atherosclerotic heart disease of native coronary artery without angina pectoris: Secondary | ICD-10-CM

## 2022-10-02 NOTE — Telephone Encounter (Signed)
Gave results/message to the patient. Told him that someone will contact him to schedule the POET. He verbalized understanding.

## 2022-10-02 NOTE — Telephone Encounter (Signed)
Patient is returning RN's call. Please advise. 

## 2022-10-02 NOTE — Telephone Encounter (Signed)
-----   Message from Rollene Rotunda, MD sent at 10/01/2022  5:11 PM EDT ----- He does have some coronary calcium.  I would like to have him do a POET (Plain Old Exercise Treadmill) and then I can talk to him back in the office about goals of therapy.  Call Jose Oliver with the results and send results to Assunta Found, MD

## 2022-11-16 ENCOUNTER — Telehealth (HOSPITAL_COMMUNITY): Payer: Self-pay

## 2022-11-16 NOTE — Telephone Encounter (Signed)
Detailed instructions left on the patient's answering machine. Asked to call back with any questions. S.Antar Milks EMTP/CCT 

## 2022-11-16 NOTE — Telephone Encounter (Deleted)
Detailed instructions left on the patient's answering machine. Asked to call back with any

## 2022-11-20 ENCOUNTER — Ambulatory Visit (HOSPITAL_COMMUNITY): Payer: 59

## 2022-11-21 ENCOUNTER — Ambulatory Visit (HOSPITAL_COMMUNITY): Payer: 59 | Attending: Cardiology

## 2022-11-21 DIAGNOSIS — I2510 Atherosclerotic heart disease of native coronary artery without angina pectoris: Secondary | ICD-10-CM | POA: Insufficient documentation

## 2022-11-21 LAB — EXERCISE TOLERANCE TEST
Angina Index: 0
Estimated workload: 13.4
Exercise duration (min): 12 min
Exercise duration (sec): 0 s
MPHR: 164 {beats}/min
Peak HR: 176 {beats}/min
Percent HR: 107 %
RPE: 17
Rest HR: 75 {beats}/min

## 2022-12-21 ENCOUNTER — Encounter (HOSPITAL_COMMUNITY): Payer: 59

## 2023-02-09 ENCOUNTER — Other Ambulatory Visit (HOSPITAL_COMMUNITY): Payer: Self-pay | Admitting: Family Medicine

## 2023-02-09 DIAGNOSIS — M545 Low back pain, unspecified: Secondary | ICD-10-CM

## 2023-02-09 DIAGNOSIS — Z87442 Personal history of urinary calculi: Secondary | ICD-10-CM

## 2023-02-13 ENCOUNTER — Ambulatory Visit (HOSPITAL_BASED_OUTPATIENT_CLINIC_OR_DEPARTMENT_OTHER)
Admission: RE | Admit: 2023-02-13 | Discharge: 2023-02-13 | Disposition: A | Discharge status: Home / Self Care | Payer: 59 | Source: Ambulatory Visit | Attending: Family Medicine | Admitting: Family Medicine

## 2023-02-13 DIAGNOSIS — Z87442 Personal history of urinary calculi: Secondary | ICD-10-CM | POA: Insufficient documentation

## 2023-02-13 DIAGNOSIS — M545 Low back pain, unspecified: Secondary | ICD-10-CM | POA: Insufficient documentation

## 2023-03-30 NOTE — Progress Notes (Signed)
 Jose Oliver MRN: [id number] DOB: Apr 23, 1966 (age: 57 y.o.)   Return Visit PCP:  Norleen Red General, MD Chief Complaint:  Chief Complaint  Patient presents with  . Right Hand - Follow-up    Right middle finger trigger.   Last Visit: Visit date not found  History of Present Illness: Jose Oliver is a previous patient of Dr. Sissy.  He has had a right long finger trigger digit.  This been injected 2 times.  The triggering has recurred.  It is bothersome to.  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  He is interested in surgical release.  Allergies: No Known Allergies  Past Medical History: History reviewed. No pertinent past medical history.  Past Surgical History: History reviewed. No pertinent surgical history.  Medications:  Current Outpatient Medications on File Prior to Visit  Medication Sig Dispense Refill  . amLODIPine  (NORVASC ) 2.5 mg tablet Take 2.5 mg by mouth.    . celecoxib (CeleBREX) 200 mg capsule Take 200 mg by mouth Once Daily.    SABRA doxycycline (VIBRAMYCIN) 100 mg capsule     . multivitamin with minerals (Daily Multivitamin-Minerals) tab Take 1 tablet by mouth Once Daily.    SABRA olmesartan (BENICAR) 40 mg tablet Take 40 mg by mouth Once Daily.    SABRA omega-3 fatty acids-fish oil (Fish OiL) 340-1,000 mg cap capsule Take 1 capsule by mouth Once Daily.     No current facility-administered medications on file prior to visit.    Family History:  Family History  Problem Relation Name Age of Onset  . Hypertension Mother    . Arthritis Mother      Social History:    Vitals:  Vitals:   03/30/23 0859  BP: 136/84  Pulse: 73  Resp: 18  Temp: 97.9 F (36.6 C)  SpO2: 97%    Physical Exam:  Alert and oriented x 3. Well developed, well nourished. Regular gait.  Right upper extremity: Intact to light touch sensation and capillary refill in the fingertips.  There is good soft tissue turgor in the fingertips.  He can flex and extend  the IP joint of the thumb and can cross his fingers.   No wounds. No swelling, erythema, or ecchymosis. No rashes or lesions.  No signs or symptoms of dystrophy.   Compartments are soft. There is a palpable and tender flexor tendon nodule at the volar aspect of the MP joint of the long finger. This can be made to click with compression over the A1 pulley.   Special Investigations- Review of Diagnostic Tests:   Radiologic Studies: No radiographs taken or reviewed today.  Assessment:    ICD-10-CM   1. Trigger finger, right middle finger  M65.331       Plan: I discussed with Jose Oliver the nature of trigger digits.  This has been injected previously without resolution.  We discussed surgical release.  Risks, benefits, and alternatives of surgery were discussed including risk of blood loss, infection, damage to nerves, the vessels, tendons, ligaments, bone, need for additional surgery, complications with wound healing, continued pain, recurrence of triggering.  He voiced understanding of these risks and would like to proceed.  We will have it arranged at his convenience.   Follow up: No follow-ups on file.  No orders of the defined types were placed in this encounter.

## 2023-04-02 ENCOUNTER — Other Ambulatory Visit: Payer: Self-pay | Admitting: Orthopedic Surgery

## 2023-04-30 ENCOUNTER — Encounter (HOSPITAL_BASED_OUTPATIENT_CLINIC_OR_DEPARTMENT_OTHER): Payer: Self-pay | Admitting: Orthopedic Surgery

## 2023-04-30 ENCOUNTER — Other Ambulatory Visit: Payer: Self-pay

## 2023-04-30 NOTE — Progress Notes (Signed)
   04/30/23 1011  Pre-op Phone Call  Surgery Date Verified 05/07/23  Arrival Time Verified 1300  Surgery Location Verified [facility]  Medical History Reviewed Yes  Is the patient taking a GLP-1 receptor agonist? No  Does the patient have diabetes? No diagnosis of diabetes  Do you have a history of heart problems? Yes  Cardiologist Name Dr. Melton Alar  Have you ever had tests on your heart? Yes  What cardiac tests were performed? EKG;Stress Test  What date/year were cardiac tests completed? 2024  Results viewable: [facility] [employer] Tab;Care Everywhere  Does patient have other implanted devices? No  Patient Teaching Pre / Post Procedure;Enhanced Recovery  Patient educated about smoking cessation 24 hours prior to surgery. N/A Non-Smoker  Patient verbalizes understanding of bowel prep? N/A  THA/TKA patients only:  By your surgery date, will you have been taking narcotics for 90 days or greater? No  Med Rec Completed Yes  Take the Following Meds the Morning of Surgery Amlodipine  Recent  Lab Work, EKG, CXR? No  NPO (Including gum & candy) After midnight  Allowed clear liquids Gatorade  (diabetics please choose diet or no sugar options);Water  Patient instructed to stop clear liquids including Carb loading drink at: 1130  Stop Solids, Milk, Candy, and Gum STARTING AT MIDNIGHT  Responsible adult to drive and be with you for 24 hours? Yes  Name & Phone Number for Ride/Caregiver wife Virl Axe  No Jewelry, money, nail polish or make-up.  No lotions, powders, perfumes. No shaving  48 hrs. prior to surgery. Yes  Contacts, Dentures & Glasses Will Have to be Removed Before OR. Yes  Please bring your ID and Insurance Card the morning of your surgery. (Surgery Centers Only) Yes  Bring any papers or x-rays with you that your surgeon gave you. Yes  Instructed to contact the location of procedure/ provider if they or anyone in their household develops symptoms or tests positive for COVID-19, has close contact with  someone who tests positive for COVID, or has known exposure to any contagious illness. Yes  Call this number the morning of surgery  with any problems that may cancel your surgery. [phone]  Covid-19 Assessment  Have you had a positive COVID-19 test within the previous 90 days? No  COVID Testing Guidance Proceed with the additional questions.  Patient's surgery required a COVID-19 test (cardiothoracic, complex ENT, and bronchoscopies/ EBUS) No  Have you been unmasked and in close contact with anyone with COVID-19 or COVID-19 symptoms within the past 10 days? No  Do you or anyone in your household currently have any COVID-19 symptoms? No

## 2023-05-07 ENCOUNTER — Ambulatory Visit (HOSPITAL_BASED_OUTPATIENT_CLINIC_OR_DEPARTMENT_OTHER): Payer: 59 | Admitting: Anesthesiology

## 2023-05-07 ENCOUNTER — Encounter (HOSPITAL_BASED_OUTPATIENT_CLINIC_OR_DEPARTMENT_OTHER)
Admission: RE | Disposition: A | Discharge status: Home / Self Care | Payer: Self-pay | Source: Home / Self Care | Attending: Orthopedic Surgery

## 2023-05-07 ENCOUNTER — Other Ambulatory Visit: Payer: Self-pay

## 2023-05-07 ENCOUNTER — Encounter (HOSPITAL_BASED_OUTPATIENT_CLINIC_OR_DEPARTMENT_OTHER): Payer: Self-pay | Admitting: Orthopedic Surgery

## 2023-05-07 ENCOUNTER — Ambulatory Visit (HOSPITAL_BASED_OUTPATIENT_CLINIC_OR_DEPARTMENT_OTHER)
Admission: RE | Admit: 2023-05-07 | Discharge: 2023-05-07 | Disposition: A | Discharge status: Home / Self Care | Payer: 59 | Attending: Orthopedic Surgery | Admitting: Orthopedic Surgery

## 2023-05-07 DIAGNOSIS — M65331 Trigger finger, right middle finger: Secondary | ICD-10-CM | POA: Insufficient documentation

## 2023-05-07 DIAGNOSIS — I10 Essential (primary) hypertension: Secondary | ICD-10-CM | POA: Diagnosis not present

## 2023-05-07 HISTORY — PX: TRIGGER FINGER RELEASE: SHX641

## 2023-05-07 SURGERY — RELEASE, A1 PULLEY, FOR TRIGGER FINGER
Anesthesia: General | Site: Hand | Laterality: Right

## 2023-05-07 MED ORDER — EPHEDRINE SULFATE-NACL 50-0.9 MG/10ML-% IV SOSY
PREFILLED_SYRINGE | INTRAVENOUS | Status: DC | PRN
  Administered 2023-05-07 (×2): 5 mg via INTRAVENOUS

## 2023-05-07 MED ORDER — CEFAZOLIN SODIUM-DEXTROSE 2-4 GM/100ML-% IV SOLN
2.0000 g | INTRAVENOUS | Status: AC
  Administered 2023-05-07: 2 g via INTRAVENOUS

## 2023-05-07 MED ORDER — FENTANYL CITRATE (PF) 250 MCG/5ML IJ SOLN
INTRAMUSCULAR | Status: DC | PRN
  Administered 2023-05-07: 100 ug via INTRAVENOUS

## 2023-05-07 MED ORDER — FENTANYL CITRATE (PF) 100 MCG/2ML IJ SOLN: 25.0000 ug | INTRAMUSCULAR | Status: DC | PRN

## 2023-05-07 MED ORDER — PHENYLEPHRINE 80 MCG/ML (10ML) SYRINGE FOR IV PUSH (FOR BLOOD PRESSURE SUPPORT)
PREFILLED_SYRINGE | INTRAVENOUS | Status: DC | PRN
  Administered 2023-05-07: 160 ug via INTRAVENOUS

## 2023-05-07 MED ORDER — BUPIVACAINE HCL (PF) 0.25 % IJ SOLN
INTRAMUSCULAR | Status: DC | PRN
  Administered 2023-05-07: 9 mL

## 2023-05-07 MED ORDER — AMISULPRIDE (ANTIEMETIC) 5 MG/2ML IV SOLN: 10.0000 mg | Freq: Once | INTRAVENOUS | Status: DC | PRN

## 2023-05-07 MED ORDER — MIDAZOLAM HCL 2 MG/2ML IJ SOLN
INTRAMUSCULAR | Status: DC | PRN
  Administered 2023-05-07: 2 mg via INTRAVENOUS

## 2023-05-07 MED ORDER — LACTATED RINGERS IV SOLN: INTRAVENOUS | Status: DC

## 2023-05-07 MED ORDER — ACETAMINOPHEN 500 MG PO TABS
1000.0000 mg | ORAL_TABLET | Freq: Once | ORAL | Status: AC
  Administered 2023-05-07: 1000 mg via ORAL

## 2023-05-07 MED ORDER — LIDOCAINE 2% (20 MG/ML) 5 ML SYRINGE
INTRAMUSCULAR | Status: DC | PRN
  Administered 2023-05-07: 60 mg via INTRAVENOUS

## 2023-05-07 MED ORDER — SODIUM CHLORIDE 0.9 % IV SOLN: INTRAVENOUS | Status: DC | PRN

## 2023-05-07 MED ORDER — ONDANSETRON HCL 4 MG/2ML IJ SOLN
INTRAMUSCULAR | Status: AC
  Filled 2023-05-07: qty 4

## 2023-05-07 MED ORDER — ONDANSETRON HCL 4 MG/2ML IJ SOLN: 4.0000 mg | Freq: Once | INTRAMUSCULAR | Status: DC | PRN

## 2023-05-07 MED ORDER — PROPOFOL 10 MG/ML IV BOLUS
INTRAVENOUS | Status: DC | PRN
  Administered 2023-05-07: 200 mg via INTRAVENOUS

## 2023-05-07 MED ORDER — OXYCODONE HCL 5 MG PO TABS: 5.0000 mg | ORAL_TABLET | Freq: Once | ORAL | Status: DC | PRN

## 2023-05-07 MED ORDER — FENTANYL CITRATE (PF) 100 MCG/2ML IJ SOLN
INTRAMUSCULAR | Status: AC
  Filled 2023-05-07: qty 2

## 2023-05-07 MED ORDER — 0.9 % SODIUM CHLORIDE (POUR BTL) OPTIME
TOPICAL | Status: DC | PRN
  Administered 2023-05-07: 500 mL

## 2023-05-07 MED ORDER — MIDAZOLAM HCL 2 MG/2ML IJ SOLN
INTRAMUSCULAR | Status: AC
  Filled 2023-05-07: qty 2

## 2023-05-07 MED ORDER — OXYCODONE HCL 5 MG/5ML PO SOLN: 5.0000 mg | Freq: Once | ORAL | Status: DC | PRN

## 2023-05-07 MED ORDER — ONDANSETRON HCL 4 MG/2ML IJ SOLN
INTRAMUSCULAR | Status: DC | PRN
  Administered 2023-05-07: 4 mg via INTRAVENOUS

## 2023-05-07 MED ORDER — TRAMADOL HCL 50 MG PO TABS: ORAL_TABLET | ORAL | 0 refills | Status: DC

## 2023-05-07 MED ORDER — DEXAMETHASONE SODIUM PHOSPHATE 10 MG/ML IJ SOLN
INTRAMUSCULAR | Status: AC
  Filled 2023-05-07: qty 1

## 2023-05-07 MED ORDER — DEXAMETHASONE SODIUM PHOSPHATE 10 MG/ML IJ SOLN
INTRAMUSCULAR | Status: DC | PRN
  Administered 2023-05-07: 4 mg via INTRAVENOUS

## 2023-05-07 SURGICAL SUPPLY — 26 items
BLADE SURG 15 STRL LF DISP TIS (BLADE) ×2 IMPLANT
BNDG COHESIVE 2X5 TAN ST LF (GAUZE/BANDAGES/DRESSINGS) ×1 IMPLANT
BNDG ESMARK 4X9 LF (GAUZE/BANDAGES/DRESSINGS) IMPLANT
CHLORAPREP W/TINT 26 (MISCELLANEOUS) ×1 IMPLANT
CORD BIPOLAR FORCEPS 12FT (ELECTRODE) ×1 IMPLANT
COVER BACK TABLE 60X90IN (DRAPES) ×1 IMPLANT
COVER MAYO STAND STRL (DRAPES) ×1 IMPLANT
CUFF TOURN SGL QUICK 18X4 (TOURNIQUET CUFF) ×1 IMPLANT
DRAPE EXTREMITY T 121X128X90 (DISPOSABLE) ×1 IMPLANT
DRAPE SURG 17X23 STRL (DRAPES) ×1 IMPLANT
GAUZE SPONGE 4X4 12PLY STRL (GAUZE/BANDAGES/DRESSINGS) ×1 IMPLANT
GAUZE XEROFORM 1X8 LF (GAUZE/BANDAGES/DRESSINGS) ×1 IMPLANT
GLOVE BIO SURGEON STRL SZ7.5 (GLOVE) ×1 IMPLANT
GLOVE BIOGEL PI IND STRL 8 (GLOVE) ×1 IMPLANT
GOWN STRL REUS W/ TWL LRG LVL3 (GOWN DISPOSABLE) ×1 IMPLANT
GOWN STRL REUS W/TWL XL LVL3 (GOWN DISPOSABLE) ×1 IMPLANT
NDL HYPO 25X1 1.5 SAFETY (NEEDLE) ×1 IMPLANT
NEEDLE HYPO 25X1 1.5 SAFETY (NEEDLE) ×1 IMPLANT
NS IRRIG 1000ML POUR BTL (IV SOLUTION) ×1 IMPLANT
PACK BASIN DAY SURGERY FS (CUSTOM PROCEDURE TRAY) ×1 IMPLANT
STOCKINETTE 4X48 STRL (DRAPES) ×1 IMPLANT
SUT ETHILON 4 0 PS 2 18 (SUTURE) ×1 IMPLANT
SYR BULB EAR ULCER 3OZ GRN STR (SYRINGE) ×1 IMPLANT
SYR CONTROL 10ML LL (SYRINGE) ×1 IMPLANT
TOWEL GREEN STERILE FF (TOWEL DISPOSABLE) ×2 IMPLANT
UNDERPAD 30X36 HEAVY ABSORB (UNDERPADS AND DIAPERS) ×1 IMPLANT

## 2023-05-07 NOTE — Op Note (Signed)
05/07/2023 [facility]  Operative Note  PREOPERATIVE DIAGNOSIS: trigger release right middle finger  POSTOPERATIVE DIAGNOSIS:  trigger release right middle finger  PROCEDURE: Procedure(s): RELEASE TRIGGER FINGER/A-1 PULLEY   SURGEON:  Betha Loa, MD  ASSISTANT:  none.  ANESTHESIA:  General.  IV FLUIDS:  Per anesthesia flow sheet.  ESTIMATED BLOOD LOSS:  Minimal.  COMPLICATIONS:  None.  SPECIMENS:  None.  TOURNIQUET TIME:  Total Tourniquet Time Documented: Forearm (Right) - 7 minutes Total: Forearm (Right) - 7 minutes   DISPOSITION:  Stable to PACU.  LOCATION: [facility]  INDICATIONS: Jose Oliver is a 57 y.o. male with triggering of the long finger.  This has been injected without lasting resolution.  He wishes to proceed with surgical trigger release.  Risks, benefits and alternatives of surgery were discussed including the risk of blood loss, infection, damage to nerves, vessels, tendons, ligaments, bone, failure of surgery, need for additional surgery, complications with wound healing, continued pain, continued triggering and need for repeat surgery.  He voiced understanding of these risks and elected to proceed.  OPERATIVE COURSE:  After being identified preoperatively by myself, the patient and I agreed upon the procedure and site of procedure.  The surgical site was marked. Surgical consent had been signed. He was given IV Ancef as preoperative antibiotic prophylaxis. He was transported to the operating room and placed on the operating room table in supine position with the Right upper extremity on an arm board. General anesthesia was induced by the anesthesiologist.  The Right upper extremity was prepped and draped in normal sterile orthopedic fashion. A surgical pause was performed between surgeons, anesthesia, and operating room staff, and all were in agreement as to the patient, procedure, and site of procedure.  Tourniquet at the  proximal aspect of the forearm was inflated to 250 mmHg after exsanguination of the arm with an Esmarch bandage.  An incision was made at the volar aspect of the MP joint of the long finger.  This was carried into the subcutaneous tissues by spreading technique.  Bipolar electrocautery was used to obtain hemostasis.  The radial and ulnar digital nerves were protected throughout the case. The flexor sheath was identified.  The A1 pulley was identified and sharply incised.  It was released in its entirety.  The proximal 1-2 mm of the A2 pulley was vented to allow better excursion of the tendons.  The finger was placed through a range of motion and there was noted to be no catching.  The tendons were brought through the wound and any adherences released.  The wound was then copiously irrigated with sterile saline. It was closed with 4-0 nylon in a horizontal mattress fashion.  It was injected with 0.25% plain Marcaine to aid in postoperative analgesia.  It was dressed with sterile Xeroform, 4x4s, and wrapped lightly with a Coban dressing.  Tourniquet was deflated at 7 minutes.  The fingertips were pink with brisk capillary refill after deflation of the tourniquet.  The operative drapes were broken down and the patient was awoken from anesthesia safely.  He was transferred back to the stretcher and taken to the PACU in stable condition.   I will see him back in the office in 1 week for postoperative followup.  I will give him a prescription for Tramadol 50 mg 1-2 tabs PO q6 hours prn pain, dispense # 20.    Betha Loa, MD Electronically signed, 05/07/23

## 2023-05-07 NOTE — Anesthesia Procedure Notes (Signed)
Procedure Name: LMA Insertion Date/Time: 05/07/2023 2:26 PM  Performed by: Alvera Novel, CRNAPre-anesthesia Checklist: Patient identified, Emergency Drugs available, Suction available and Patient being monitored Patient Re-evaluated:Patient Re-evaluated prior to induction Oxygen Delivery Method: Circle System Utilized Preoxygenation: Pre-oxygenation with 100% oxygen Induction Type: IV induction Ventilation: Mask ventilation without difficulty LMA: LMA inserted LMA Size: 5.0 Number of attempts: 1 Airway Equipment and Method: Bite block Placement Confirmation: positive ETCO2 Tube secured with: Tape Dental Injury: Teeth and Oropharynx as per pre-operative assessment

## 2023-05-07 NOTE — Discharge Instructions (Addendum)
[facility] Instructions Hand Surgery  Wound Care: Keep your hand elevated above the level of your heart.  Do not allow it to dangle by your side.  Keep the dressing dry and do not remove it unless your doctor advises you to do so.  He will usually change it at the time of your post-op visit.  Moving your fingers is advised to stimulate circulation but will depend on the site of your surgery.  If you have a splint applied, your doctor will advise you regarding movement.  Activity: Do not drive or operate machinery today.  Rest today and then you may return to your normal activity and work as indicated by your physician.  Diet:  Drink liquids today or eat a light diet.  You may resume a regular diet tomorrow.    General expectations: Pain for two to three days. Fingers may become slightly swollen.  Call your doctor if any of the following occur: Severe pain not relieved by pain medication. Elevated temperature. Dressing soaked with blood. Inability to move fingers. White or bluish color to fingers.    Post Anesthesia Home Care Instructions  Activity: Get plenty of rest for the remainder of the day. A responsible individual must stay with you for 24 hours following the procedure.  For the next 24 hours, DO NOT: -Drive a car -Advertising copywriter -Drink alcoholic beverages -Take any medication unless instructed by your physician -Make any legal decisions or sign important papers.  Meals: Start with liquid foods such as gelatin or soup. Progress to regular foods as tolerated. Avoid greasy, spicy, heavy foods. If nausea and/or vomiting occur, drink only clear liquids until the nausea and/or vomiting subsides. Call your physician if vomiting continues.  Special Instructions/Symptoms: Your throat may feel dry or sore from the anesthesia or the breathing tube placed in your throat during surgery. If this causes discomfort, gargle with warm salt water. The discomfort should disappear  within 24 hours.  If you had a scopolamine patch placed behind your ear for the management of post- operative nausea and/or vomiting:  1. The medication in the patch is effective for 72 hours, after which it should be removed.  Wrap patch in a tissue and discard in the trash. Wash hands thoroughly with soap and water. 2. You may remove the patch earlier than 72 hours if you experience unpleasant side effects which may include dry mouth, dizziness or visual disturbances. 3. Avoid touching the patch. Wash your hands with soap and water after contact with the patch.    Next dose of tylenol if needed at 7:19pm

## 2023-05-07 NOTE — Transfer of Care (Signed)
Immediate Anesthesia Transfer of Care Note  Patient: Jose Oliver  Procedure(s) Performed: RELEASE TRIGGER FINGER/A-1 PULLEY (Right: Hand)  Patient Location: PACU  Anesthesia Type:General  Level of Consciousness: awake, alert , and oriented  Airway & Oxygen Therapy: Patient Spontanous Breathing and Patient connected to face mask oxygen  Post-op Assessment: Report given to RN and Post -op Vital signs reviewed and stable  Post vital signs: Reviewed and stable  Last Vitals:  Vitals Value Taken Time  BP 110/83 (93)   Temp    Pulse 71   Resp 16   SpO2 97%     Last Pain:  Vitals:   05/07/23 1320  TempSrc: Oral  PainSc: 2       Patients Stated Pain Goal: 5 (05/07/23 1320)  Complications: No notable events documented.

## 2023-05-07 NOTE — Anesthesia Postprocedure Evaluation (Signed)
Anesthesia Post Note  Patient: Jose Oliver  Procedure(s) Performed: RELEASE TRIGGER FINGER/A-1 PULLEY (Right: Hand)     Patient location during evaluation: PACU Anesthesia Type: General Level of consciousness: awake and alert, oriented and patient cooperative Pain management: pain level controlled Vital Signs Assessment: post-procedure vital signs reviewed and stable Respiratory status: spontaneous breathing, nonlabored ventilation and respiratory function stable Cardiovascular status: blood pressure returned to baseline and stable Postop Assessment: no apparent nausea or vomiting Anesthetic complications: no   No notable events documented.  Last Vitals:  Vitals:   05/07/23 1453 05/07/23 1500  BP: 110/83 120/80  Pulse: 79 68  Resp: 14 15  Temp: 36.6 C   SpO2:  96%    Last Pain:  Vitals:   05/07/23 1500  TempSrc:   PainSc: 0-No pain                 Lannie Fields

## 2023-05-07 NOTE — Anesthesia Preprocedure Evaluation (Addendum)
Anesthesia Evaluation  Patient identified by MRN, date of birth, ID band Patient awake    Reviewed: Allergy & Precautions, NPO status , Patient's Chart, lab work & pertinent test results  Airway Mallampati: III  TM Distance: >3 FB Neck ROM: Full    Dental  (+) Teeth Intact, Dental Advisory Given   Pulmonary  Snores at night, wasn't able to tolerate sleep study    Pulmonary exam normal breath sounds clear to auscultation       Cardiovascular hypertension (171/86 preop, normally 130s SBP), Pt. on medications Normal cardiovascular exam Rhythm:Regular Rate:Normal     Neuro/Psych negative neurological ROS  negative psych ROS   GI/Hepatic negative GI ROS, Neg liver ROS,,,  Endo/Other  Obesity BMI 32  Renal/GU negative Renal ROS  negative genitourinary   Musculoskeletal negative musculoskeletal ROS (+)    Abdominal  (+) + obese  Peds  Hematology negative hematology ROS (+)   Anesthesia Other Findings   Reproductive/Obstetrics negative OB ROS                             Anesthesia Physical Anesthesia Plan  ASA: 2  Anesthesia Plan: General   Post-op Pain Management: Tylenol PO (pre-op)*   Induction:   PONV Risk Score and Plan: 2 and Ondansetron, Dexamethasone, Midazolam and Treatment may vary due to age or medical condition  Airway Management Planned: LMA  Additional Equipment: None  Intra-op Plan:   Post-operative Plan: Extubation in OR  Informed Consent: I have reviewed the patients History and Physical, chart, labs and discussed the procedure including the risks, benefits and alternatives for the proposed anesthesia with the patient or authorized representative who has indicated his/her understanding and acceptance.     Dental advisory given  Plan Discussed with: CRNA  Anesthesia Plan Comments:        Anesthesia Quick Evaluation

## 2023-05-07 NOTE — H&P (Signed)
Jose Oliver is an 57 y.o. male.   Chief Complaint: trigger digit HPI: 57 yo male with triggering right long finger.  It is bothersome to him.  He wishes to proceed with trigger release.  Allergies: No Known Allergies  Past Medical History:  Diagnosis Date   Hypertension    Palpitations     Past Surgical History:  Procedure Laterality Date   BACK SURGERY     COLONOSCOPY N/A 06/29/2016   Procedure: COLONOSCOPY;  Surgeon: Corbin Ade, MD;  [facility]: [facility];  Service: Endoscopy;  Laterality: N/A;  8:30 AM   KNEE SURGERY     POLYPECTOMY  06/29/2016   Procedure: POLYPECTOMY;  Surgeon: Corbin Ade, MD;  Location: [facility];  Service: Endoscopy;;  sigmoid colon    Family History: Family History  Problem Relation Age of Onset   Hypertension Mother    Diverticulitis Mother    Cancer Father        Prostated   Crohn's disease Sister    Arrhythmia Son        SVT   Colon cancer Neg Hx     Social History:   reports that he has never smoked. He has never used smokeless tobacco. He reports current alcohol use. He reports that he does not use drugs.  Medications: Medications Prior to Admission  Medication Sig Dispense Refill   amLODipine (NORVASC) 2.5 MG tablet Take 1 tablet (2.5 mg total) by mouth daily. 90 tablet 3   celecoxib (CELEBREX) 200 MG capsule Take 200 mg by mouth 2 (two) times daily.     Multiple Vitamins-Minerals (MULTIVITAMIN WITH MINERALS) tablet Take 1 tablet by mouth daily.     olmesartan (BENICAR) 40 MG tablet Take 40 mg by mouth daily.     Omega-3 Fatty Acids (FISH OIL) 1000 MG CAPS Take 1 capsule by mouth daily.      No results found for this or any previous visit (from the past 48 hour(s)).  No results found.    Height 5\' 10"  (1.778 m), weight 102.1 kg.  General appearance: alert, cooperative, and appears stated age Head: Normocephalic, without obvious abnormality, atraumatic Neck: supple, symmetrical, trachea midline Extremities: Intact  sensation and capillary refill all digits.  +epl/fpl/io.  No wounds.  Pulses: 2+ and symmetric Skin: Skin color, texture, turgor normal. No rashes or lesions Neurologic: Grossly normal Incision/Wound: none  Assessment/Plan Right long finger trigger digit.  Non operative and operative treatment options have been discussed with the patient and patient wishes to proceed with operative treatment. Risks, benefits, and alternatives of surgery have been discussed and the patient agrees with the plan of care.   Betha Loa 05/07/2023, 1:07 PM

## 2023-05-08 ENCOUNTER — Encounter (HOSPITAL_BASED_OUTPATIENT_CLINIC_OR_DEPARTMENT_OTHER): Payer: Self-pay | Admitting: Orthopedic Surgery

## 2023-05-14 NOTE — Progress Notes (Signed)
 Lamount Bankson DOB: 1965-06-21 MRN: [id number]  Wille Aubuchon is being seen in follow up for right long finger trigger release.  He is 1 week postop.  He states he is doing well.  No complaints.  Has been in the postoperative dressing.  Examination: Wound looks good without erythema or drainage.  Can flex the finger to the palm and extend without triggering.  Assessment and plan: Status post above-noted procedures.  Doing well overall.  Will keep the wound clean and covered.  Will see him back in a week for removal of sutures.  He agrees with the plan of care.

## 2024-02-26 ENCOUNTER — Other Ambulatory Visit: Payer: Self-pay | Admitting: Orthopedic Surgery

## 2024-02-26 NOTE — Progress Notes (Signed)
 Jose Oliver MRN: [id number] DOB: 17-Feb-1966 (age: 58 y.o.)   Return Visit PCP:  Norleen Red General, MD Chief Complaint:  Chief Complaint  Patient presents with  . Left Middle Finger - Pain    Trigger finger symptoms getting worse   Last Visit: Visit date not found  History of Present Illness: Jose Oliver is being seen for new problem today.  He has noted triggering of the left long finger.  He has also began to have triggering of the ring finger.  These are bothersome to him.  He has had previous right long finger trigger that required surgical release and has done very well.  Allergies: Allergies[1]  Past Medical History: Medical History[2]  Past Surgical History: Surgical History[3]  Medications: Medications Ordered Prior to Encounter[4]  Family History: Family History[5]  Social History:    Vitals:  Vitals:   02/26/24 0944  BP: 155/77  Pulse: 66    Physical Exam:  Alert and oriented x 3. Well developed, well nourished. Regular gait.  Left upper extremity: Intact to light touch sensation and capillary refill in the fingertips.  There is good soft tissue turgor in the fingertips.  He can flex and extend the IP joint of the thumb and can cross his fingers.   No wounds. No swelling, erythema, or ecchymosis. No rashes or lesions.  No signs or symptoms of dystrophy.   Compartments are soft. There is a palpable and tender flexor tendon nodule at the volar aspect of the MP joint of the long finger and ring finger. There is demonstrable triggering in the long finger and ring finger.   Special Investigations- Review of Diagnostic Tests:   Radiologic Studies: I have personally reviewed radiographs taken today and my interpretation is as follows: AP lateral and Henry views of the left hand and wrist show no fracture dislocation or radiopaque foreign body.  Degenerative change at the [facility] joint of the thumb with joint space loss subchondral sclerosis and osteophyte  formation.  Assessment:    ICD-10-CM   1. Trigger middle finger of left hand  M65.332 XR Hand 2 Views Left    2. Trigger ring finger of left hand  M65.342       Plan: I discussed with Giorgio Mullinax the nature of trigger digits.  He wishes to forego injections as he has done well on the right side after trigger release.  We discussed surgical release.  Risks, benefits, and alternatives of surgery were discussed including risk of blood loss, infection, damage to nerves, the vessels, tendons, ligaments, bone, need for additional surgery, complications with wound healing, continued pain, recurrence of triggering.  He voiced understanding of these risks and would like to proceed.  We will have it arranged at his convenience.   Follow up: Return for First post-op visit at 1 week.  Orders Placed This Encounter  Procedures  . XR Hand 2 Views Left          [1] No Known Allergies [2] No past medical history on file. [3] Past Surgical History: Procedure Laterality Date  . FINGER SURGERY Right 05/07/2023   RMF a-1 pulley release  [4] Current Outpatient Medications on File Prior to Visit  Medication Sig Dispense Refill  . amLODIPine  (NORVASC ) 2.5 mg tablet Take 2.5 mg by mouth.    . celecoxib (CeleBREX) 200 mg capsule Take 200 mg by mouth Once Daily.    SABRA doxycycline (VIBRAMYCIN) 100 mg capsule     . multivitamin with minerals (Daily Multivitamin-Minerals) tab Take  1 tablet by mouth Once Daily.    SABRA olmesartan (BENICAR) 40 mg tablet Take 40 mg by mouth Once Daily.    SABRA omega-3 fatty acids-fish oil (Fish OiL) 340-1,000 mg cap capsule Take 1 capsule by mouth Once Daily.     No current facility-administered medications on file prior to visit.  [5] Family History Problem Relation Name Age of Onset  . Hypertension Mother    . Arthritis Mother

## 2024-03-31 ENCOUNTER — Encounter (HOSPITAL_BASED_OUTPATIENT_CLINIC_OR_DEPARTMENT_OTHER): Payer: Self-pay | Admitting: Orthopedic Surgery

## 2024-03-31 ENCOUNTER — Other Ambulatory Visit: Payer: Self-pay

## 2024-04-02 NOTE — Anesthesia Preprocedure Evaluation (Addendum)
 Anesthesia Evaluation  Patient identified by MRN, date of birth, ID band Patient awake    Reviewed: Allergy & Precautions, NPO status , Patient's Chart, lab work & pertinent test results  Airway Mallampati: III  TM Distance: >3 FB Neck ROM: Full    Dental  (+) Teeth Intact, Dental Advisory Given   Pulmonary neg pulmonary ROS   Pulmonary exam normal breath sounds clear to auscultation       Cardiovascular hypertension (143/85 preop, not taking amlodipine , took olmisartan last yesterday), Pt. on medications Normal cardiovascular exam Rhythm:Regular Rate:Normal     Neuro/Psych negative neurological ROS  negative psych ROS   GI/Hepatic negative GI ROS, Neg liver ROS,,,  Endo/Other  BMI 31  Renal/GU negative Renal ROS  negative genitourinary   Musculoskeletal negative musculoskeletal ROS (+)    Abdominal   Peds  Hematology negative hematology ROS (+)   Anesthesia Other Findings   Reproductive/Obstetrics negative OB ROS                              Anesthesia Physical Anesthesia Plan  ASA: 2  Anesthesia Plan: General   Post-op Pain Management: Tylenol  PO (pre-op)* and Toradol  IV (intra-op)*   Induction: Intravenous  PONV Risk Score and Plan: 2 and Ondansetron , Dexamethasone , Midazolam  and Treatment may vary due to age or medical condition  Airway Management Planned: LMA  Additional Equipment: None  Intra-op Plan:   Post-operative Plan: Extubation in OR  Informed Consent: I have reviewed the patients History and Physical, chart, labs and discussed the procedure including the risks, benefits and alternatives for the proposed anesthesia with the patient or authorized representative who has indicated his/her understanding and acceptance.     Dental advisory given  Plan Discussed with: CRNA  Anesthesia Plan Comments: (04/2023 other hand trigger finger under LMA, no issues)          Anesthesia Quick Evaluation

## 2024-04-03 ENCOUNTER — Encounter (HOSPITAL_BASED_OUTPATIENT_CLINIC_OR_DEPARTMENT_OTHER)
Admission: RE | Admit: 2024-04-03 | Discharge: 2024-04-03 | Disposition: A | Discharge status: Home / Self Care | Source: Ambulatory Visit | Attending: Orthopedic Surgery | Admitting: Orthopedic Surgery

## 2024-04-03 DIAGNOSIS — Z01818 Encounter for other preprocedural examination: Secondary | ICD-10-CM | POA: Insufficient documentation

## 2024-04-03 NOTE — Progress Notes (Signed)

## 2024-04-07 ENCOUNTER — Encounter (HOSPITAL_BASED_OUTPATIENT_CLINIC_OR_DEPARTMENT_OTHER)
Admission: RE | Disposition: A | Discharge status: Home / Self Care | Payer: Self-pay | Source: Home / Self Care | Attending: Orthopedic Surgery

## 2024-04-07 ENCOUNTER — Ambulatory Visit (HOSPITAL_BASED_OUTPATIENT_CLINIC_OR_DEPARTMENT_OTHER): Payer: Self-pay | Admitting: Anesthesiology

## 2024-04-07 ENCOUNTER — Other Ambulatory Visit: Payer: Self-pay

## 2024-04-07 ENCOUNTER — Ambulatory Visit (HOSPITAL_BASED_OUTPATIENT_CLINIC_OR_DEPARTMENT_OTHER)
Admission: RE | Admit: 2024-04-07 | Discharge: 2024-04-07 | Disposition: A | Discharge status: Home / Self Care | Attending: Orthopedic Surgery | Admitting: Orthopedic Surgery

## 2024-04-07 ENCOUNTER — Encounter (HOSPITAL_BASED_OUTPATIENT_CLINIC_OR_DEPARTMENT_OTHER): Payer: Self-pay | Admitting: Orthopedic Surgery

## 2024-04-07 DIAGNOSIS — Z79899 Other long term (current) drug therapy: Secondary | ICD-10-CM | POA: Insufficient documentation

## 2024-04-07 DIAGNOSIS — I10 Essential (primary) hypertension: Secondary | ICD-10-CM | POA: Insufficient documentation

## 2024-04-07 DIAGNOSIS — M65342 Trigger finger, left ring finger: Secondary | ICD-10-CM | POA: Insufficient documentation

## 2024-04-07 DIAGNOSIS — M65332 Trigger finger, left middle finger: Secondary | ICD-10-CM | POA: Diagnosis not present

## 2024-04-07 HISTORY — PX: TRIGGER FINGER RELEASE: SHX641

## 2024-04-07 SURGERY — RELEASE, A1 PULLEY, FOR TRIGGER FINGER
Anesthesia: General | Site: Finger | Laterality: Left

## 2024-04-07 MED ORDER — AMISULPRIDE (ANTIEMETIC) 5 MG/2ML IV SOLN
10.0000 mg | Freq: Once | INTRAVENOUS | Status: DC | PRN
Start: 2024-04-07 — End: 2024-04-07

## 2024-04-07 MED ORDER — ACETAMINOPHEN 500 MG PO TABS
ORAL_TABLET | ORAL | Status: AC
  Filled 2024-04-07: qty 2

## 2024-04-07 MED ORDER — ONDANSETRON HCL 4 MG/2ML IJ SOLN: 4.0000 mg | Freq: Once | INTRAMUSCULAR | Status: DC | PRN

## 2024-04-07 MED ORDER — EPHEDRINE SULFATE (PRESSORS) 25 MG/5ML IV SOSY
PREFILLED_SYRINGE | INTRAVENOUS | Status: DC | PRN
  Administered 2024-04-07: 10 mg via INTRAVENOUS

## 2024-04-07 MED ORDER — CEFAZOLIN SODIUM-DEXTROSE 2-4 GM/100ML-% IV SOLN
2.0000 g | INTRAVENOUS | Status: AC
  Administered 2024-04-07: 2 g via INTRAVENOUS

## 2024-04-07 MED ORDER — PROPOFOL 10 MG/ML IV BOLUS
INTRAVENOUS | Status: AC
  Filled 2024-04-07: qty 20

## 2024-04-07 MED ORDER — HYDROMORPHONE HCL 1 MG/ML IJ SOLN: 0.2500 mg | INTRAMUSCULAR | Status: DC | PRN

## 2024-04-07 MED ORDER — OXYCODONE HCL 5 MG PO TABS
ORAL_TABLET | ORAL | Status: AC
  Filled 2024-04-07: qty 1

## 2024-04-07 MED ORDER — FENTANYL CITRATE (PF) 100 MCG/2ML IJ SOLN
INTRAMUSCULAR | Status: DC | PRN
  Administered 2024-04-07: 100 ug via INTRAVENOUS

## 2024-04-07 MED ORDER — MEPERIDINE HCL 25 MG/ML IJ SOLN: 6.2500 mg | INTRAMUSCULAR | Status: DC | PRN

## 2024-04-07 MED ORDER — BUPIVACAINE HCL (PF) 0.25 % IJ SOLN
INTRAMUSCULAR | Status: DC | PRN
  Administered 2024-04-07: 9 mL

## 2024-04-07 MED ORDER — LIDOCAINE 2% (20 MG/ML) 5 ML SYRINGE
INTRAMUSCULAR | Status: AC
Start: 2024-04-07 — End: 2024-04-07
  Filled 2024-04-07: qty 5

## 2024-04-07 MED ORDER — ONDANSETRON HCL 4 MG/2ML IJ SOLN
INTRAMUSCULAR | Status: AC
  Filled 2024-04-07: qty 2

## 2024-04-07 MED ORDER — CEFAZOLIN SODIUM-DEXTROSE 2-4 GM/100ML-% IV SOLN
INTRAVENOUS | Status: AC
  Filled 2024-04-07: qty 100

## 2024-04-07 MED ORDER — FENTANYL CITRATE (PF) 100 MCG/2ML IJ SOLN
INTRAMUSCULAR | Status: AC
  Filled 2024-04-07: qty 2

## 2024-04-07 MED ORDER — HYDROCODONE-ACETAMINOPHEN 5-325 MG PO TABS: 1.0000 | ORAL_TABLET | Freq: Four times a day (QID) | ORAL | 0 refills | Status: AC | PRN

## 2024-04-07 MED ORDER — LIDOCAINE 2% (20 MG/ML) 5 ML SYRINGE
INTRAMUSCULAR | Status: DC | PRN
  Administered 2024-04-07: 60 mg via INTRAVENOUS

## 2024-04-07 MED ORDER — KETOROLAC TROMETHAMINE 30 MG/ML IJ SOLN: 30.0000 mg | Freq: Once | INTRAMUSCULAR | Status: DC | PRN

## 2024-04-07 MED ORDER — LIDOCAINE HCL (CARDIAC) PF 100 MG/5ML IV SOSY: PREFILLED_SYRINGE | INTRAVENOUS | Status: DC | PRN

## 2024-04-07 MED ORDER — OXYCODONE HCL 5 MG PO TABS
5.0000 mg | ORAL_TABLET | Freq: Once | ORAL | Status: AC | PRN
Start: 2024-04-07 — End: 2024-04-07
  Administered 2024-04-07: 5 mg via ORAL

## 2024-04-07 MED ORDER — LACTATED RINGERS IV SOLN
INTRAVENOUS | Status: DC
Start: 2024-04-07 — End: 2024-04-07

## 2024-04-07 MED ORDER — DEXAMETHASONE SODIUM PHOSPHATE 4 MG/ML IJ SOLN
INTRAMUSCULAR | Status: DC | PRN
  Administered 2024-04-07: 5 mg via INTRAVENOUS

## 2024-04-07 MED ORDER — OXYCODONE HCL 5 MG/5ML PO SOLN: 5.0000 mg | Freq: Once | ORAL | Status: AC | PRN

## 2024-04-07 MED ORDER — ACETAMINOPHEN 500 MG PO TABS
1000.0000 mg | ORAL_TABLET | Freq: Once | ORAL | Status: AC
  Administered 2024-04-07: 1000 mg via ORAL

## 2024-04-07 MED ORDER — PROPOFOL 10 MG/ML IV BOLUS
INTRAVENOUS | Status: DC | PRN
  Administered 2024-04-07: 50 mg via INTRAVENOUS
  Administered 2024-04-07: 200 mg via INTRAVENOUS

## 2024-04-07 MED ORDER — 0.9 % SODIUM CHLORIDE (POUR BTL) OPTIME
TOPICAL | Status: DC | PRN
  Administered 2024-04-07: 75 mL

## 2024-04-07 MED ORDER — ONDANSETRON HCL 4 MG/2ML IJ SOLN
INTRAMUSCULAR | Status: DC | PRN
  Administered 2024-04-07: 4 mg via INTRAVENOUS

## 2024-04-07 MED ORDER — EPHEDRINE 5 MG/ML INJ
INTRAVENOUS | Status: AC
  Filled 2024-04-07: qty 5

## 2024-04-07 MED ORDER — BUPIVACAINE HCL (PF) 0.25 % IJ SOLN
INTRAMUSCULAR | Status: AC
  Filled 2024-04-07: qty 90

## 2024-04-07 SURGICAL SUPPLY — 30 items
BLADE SURG 15 STRL LF DISP TIS (BLADE) ×2 IMPLANT
BNDG COHESIVE 2X5 TAN ST LF (GAUZE/BANDAGES/DRESSINGS) ×1 IMPLANT
BNDG COMPR ESMARK 4X3 LF (GAUZE/BANDAGES/DRESSINGS) IMPLANT
CHLORAPREP W/TINT 26 (MISCELLANEOUS) ×1 IMPLANT
CORD BIPOLAR FORCEPS 12FT (ELECTRODE) ×1 IMPLANT
COVER BACK TABLE 60X90IN (DRAPES) ×1 IMPLANT
COVER MAYO STAND STRL (DRAPES) ×1 IMPLANT
CUFF TOURN SGL QUICK 18X4 (TOURNIQUET CUFF) ×1 IMPLANT
DRAPE EXTREMITY T 121X128X90 (DISPOSABLE) ×1 IMPLANT
DRAPE SURG 17X23 STRL (DRAPES) ×1 IMPLANT
GAUZE SPONGE 4X4 12PLY STRL (GAUZE/BANDAGES/DRESSINGS) ×1 IMPLANT
GAUZE XEROFORM 1X8 LF (GAUZE/BANDAGES/DRESSINGS) ×1 IMPLANT
GLOVE BIO SURGEON STRL SZ7 (GLOVE) IMPLANT
GLOVE BIO SURGEON STRL SZ7.5 (GLOVE) ×1 IMPLANT
GLOVE BIOGEL PI IND STRL 7.0 (GLOVE) IMPLANT
GLOVE BIOGEL PI IND STRL 7.5 (GLOVE) IMPLANT
GLOVE BIOGEL PI IND STRL 8 (GLOVE) ×1 IMPLANT
GLOVE SURG SS PI 7.0 STRL IVOR (GLOVE) IMPLANT
GOWN STRL REUS W/ TWL LRG LVL3 (GOWN DISPOSABLE) ×1 IMPLANT
GOWN STRL REUS W/TWL XL LVL3 (GOWN DISPOSABLE) ×1 IMPLANT
NDL HYPO 25X1 1.5 SAFETY (NEEDLE) ×1 IMPLANT
NEEDLE HYPO 25X1 1.5 SAFETY (NEEDLE) ×1 IMPLANT
PACK BASIN DAY SURGERY FS (CUSTOM PROCEDURE TRAY) ×1 IMPLANT
SOLN 0.9% NACL POUR BTL 1000ML (IV SOLUTION) ×1 IMPLANT
STOCKINETTE 4X48 STRL (DRAPES) ×1 IMPLANT
SUT ETHILON 4 0 PS 2 18 (SUTURE) ×1 IMPLANT
SYR BULB EAR ULCER 3OZ GRN STR (SYRINGE) ×1 IMPLANT
SYR CONTROL 10ML LL (SYRINGE) ×1 IMPLANT
TOWEL GREEN STERILE FF (TOWEL DISPOSABLE) ×2 IMPLANT
UNDERPAD 30X36 HEAVY ABSORB (UNDERPADS AND DIAPERS) ×1 IMPLANT

## 2024-04-07 NOTE — Transfer of Care (Signed)
 Immediate Anesthesia Transfer of Care Note  Patient: Jose Oliver  Procedure(s) Performed: RELEASE, A1 PULLEY, FOR LONG AND RING TRIGGER FINGERS (Left: Finger)  Patient Location: PACU  Anesthesia Type:General  Level of Consciousness: awake, alert , and oriented  Airway & Oxygen Therapy: Patient Spontanous Breathing and Patient connected to face mask oxygen  Post-op Assessment: Report given to RN  Post vital signs: Reviewed and stable  Last Vitals:  Vitals Value Taken Time  BP 115/91 04/07/24 13:32  Temp    Pulse 73 04/07/24 13:33  Resp 17 04/07/24 13:33  SpO2 99 % 04/07/24 13:33  Vitals shown include unfiled device data.  Last Pain:  Vitals:   04/07/24 1121  TempSrc: Temporal  PainSc: 0-No pain      Patients Stated Pain Goal: 4 (04/07/24 1121)  Complications: No notable events documented.

## 2024-04-07 NOTE — H&P (Signed)
 Jose Oliver is an 58 y.o. male.   Chief Complaint: trigger digit HPI: 58 y.o. yo male with triggering of left long finger, ring finger.  He wishes to forego injections. She wishes to proceed with surgical trigger release.   Allergies: No Known Allergies  Past Medical History:  Diagnosis Date   Hypertension    Palpitations     Past Surgical History:  Procedure Laterality Date   BACK SURGERY     COLONOSCOPY N/A 06/29/2016   Procedure: COLONOSCOPY;  Surgeon: Lamar CHRISTELLA Hollingshead, MD;  Location: [facility];  Service: Endoscopy;  Laterality: N/A;  8:30 AM   KNEE SURGERY     POLYPECTOMY  06/29/2016   Procedure: POLYPECTOMY;  Surgeon: Lamar CHRISTELLA Hollingshead, MD;  Location: [facility];  Service: Endoscopy;;  sigmoid colon   TRIGGER FINGER RELEASE Right 05/07/2023   Procedure: RELEASE TRIGGER FINGER/A-1 PULLEY;  Surgeon: Murrell Drivers, MD;  Location: [facility];  Service: Orthopedics;  Laterality: Right;    Family History: Family History  Problem Relation Age of Onset   Hypertension Mother    Diverticulitis Mother    Cancer Father        Prostated   Crohn's disease Sister    Arrhythmia Son        SVT   Colon cancer Neg Hx     Social History:   reports that he has never smoked. He has never used smokeless tobacco. He reports current alcohol use. He reports that he does not use drugs.  Medications: Medications Prior to Admission  Medication Sig Dispense Refill   celecoxib (CELEBREX) 200 MG capsule Take 200 mg by mouth 2 (two) times daily.     olmesartan (BENICAR) 40 MG tablet Take 40 mg by mouth daily.     Omega-3 Fatty Acids (FISH OIL) 1000 MG CAPS Take 1 capsule by mouth daily.     amLODipine  (NORVASC ) 2.5 MG tablet Take 1 tablet (2.5 mg total) by mouth daily. 90 tablet 3   Multiple Vitamins-Minerals (MULTIVITAMIN WITH MINERALS) tablet Take 1 tablet by mouth daily.     traMADol  (ULTRAM ) 50 MG tablet 1-2 tabs PO q6 hours prn pain 20 tablet 0    No results found for this  or any previous visit (from the past 48 hours).  No results found.    Blood pressure (!) 143/85, pulse 70, temperature (!) 97 F (36.1 C), temperature source Temporal, resp. rate 16, height 5' 10 (1.778 m), weight 100.1 kg, SpO2 97%.  General appearance: alert, cooperative, and appears stated age Head: Normocephalic, without obvious abnormality, atraumatic Neck: supple, symmetrical, trachea midline Extremities: Intact sensation and capillary refill all digits.  +epl/fpl/io.  No wounds. Triggering of left long and ring fingers. Skin: Skin color, texture, turgor normal. No rashes or lesions Neurologic: Grossly normal Incision/Wound: none  Assessment/Plan Left long and ring finger trigger digits.  Non operative and operative treatment options have been discussed with the patient and patient wishes to proceed with operative treatment. Risks, benefits, and alternatives of surgery have been discussed and the patient agrees with the plan of care.   Milanni Ayub 04/07/2024, 12:36 PM

## 2024-04-07 NOTE — Op Note (Signed)
 SURGERY ASSISTANT NOTE  PLACE OF SERVICE: [facility]   PATIENT INFORMATION: Name: Jose Oliver MRN#: [id number] DOB: [birth year]  Date of surgery: 04/07/2024 Time of surgery: 1:28 PM  SURGERY ASSISTANT NOTE:  Assistant name: Isaiah Anton, PA-C Note date: 04/07/2024  I assisted Dr. Franky Curia on the following procedure(s) for the above-noted patient in the date and time documented:   Left long and left ring finger A1 pulley releases   I provided assistance on the case as follows:  Assistance with exposure, retraction, bleeding control, protection of vital structures, instrumentation  and closure.   Isaiah Anton, PA-C

## 2024-04-07 NOTE — Anesthesia Postprocedure Evaluation (Signed)
 Anesthesia Post Note  Patient: Jose Oliver  Procedure(s) Performed: RELEASE, A1 PULLEY, FOR LONG AND RING TRIGGER FINGERS (Left: Finger)     Patient location during evaluation: PACU Anesthesia Type: General Level of consciousness: awake and alert, oriented and patient cooperative Pain management: pain level controlled Vital Signs Assessment: post-procedure vital signs reviewed and stable Respiratory status: spontaneous breathing, nonlabored ventilation and respiratory function stable Cardiovascular status: blood pressure returned to baseline and stable Postop Assessment: no apparent nausea or vomiting Anesthetic complications: no   No notable events documented.  Last Vitals:  Vitals:   04/07/24 1342 04/07/24 1351  BP: 126/76 122/78  Pulse: 64 63  Resp: 15 16  Temp:  (!) 36.2 C  SpO2: 96% 96%    Last Pain:  Vitals:   04/07/24 1350  TempSrc:   PainSc: 6                  Almarie CHRISTELLA Marchi

## 2024-04-07 NOTE — Discharge Instructions (Addendum)
  Post Anesthesia Home Care Instructions  Activity: Get plenty of rest for the remainder of the day. A responsible individual must stay with you for 24 hours following the procedure.  For the next 24 hours, DO NOT: -Drive a car -Advertising copywriter -Drink alcoholic beverages -Take any medication unless instructed by your physician -Make any legal decisions or sign important papers.  Meals: Start with liquid foods such as gelatin or soup. Progress to regular foods as tolerated. Avoid greasy, spicy, heavy foods. If nausea and/or vomiting occur, drink only clear liquids until the nausea and/or vomiting subsides. Call your physician if vomiting continues.  Special Instructions/Symptoms: Your throat may feel dry or sore from the anesthesia or the breathing tube placed in your throat during surgery. If this causes discomfort, gargle with warm salt water . The discomfort should disappear within 24 hours.  If you had a scopolamine patch placed behind your ear for the management of post- operative nausea and/or vomiting:  1. The medication in the patch is effective for 72 hours, after which it should be removed.  Wrap patch in a tissue and discard in the trash. Wash hands thoroughly with soap and water . 2. You may remove the patch earlier than 72 hours if you experience unpleasant side effects which may include dry mouth, dizziness or visual disturbances. 3. Avoid touching the patch. Wash your hands with soap and water  after contact with the patch.     No tylenol  until after 530pm  [facility] Instructions Hand Surgery  Wound Care: Keep your hand elevated above the level of your heart.  Do not allow it to dangle by your side.  Keep the dressing dry and do not remove it unless your doctor advises you to do so.  He will usually change it at the time of your post-op visit.  Moving your fingers is advised to stimulate circulation but will depend on the site of your surgery.  If you have a splint  applied, your doctor will advise you regarding movement.  Activity: Do not drive or operate machinery today.  Rest today and then you may return to your normal activity and work as indicated by your physician.  Diet:  Drink liquids today or eat a light diet.  You may resume a regular diet tomorrow.    General expectations: Pain for two to three days. Fingers may become slightly swollen.  Call your doctor if any of the following occur: Severe pain not relieved by pain medication. Elevated temperature. Dressing soaked with blood. Inability to move fingers. White or bluish color to fingers.

## 2024-04-07 NOTE — Op Note (Signed)
 04/07/2024 [facility]  Operative Note  PREOPERATIVE DIAGNOSIS: LEFT LONG FINGER AND LEFT RING FINGER TRIGGER DIGITS  POSTOPERATIVE DIAGNOSIS:  LEFT LONG FINGER AND LEFT RING FINGER TRIGGER DIGITS  PROCEDURE: Procedure(s): RELEASE, A1 PULLEY, FOR LONG TRIGGER FINGERS 2. RELEASE, A1 PULLEY, FOR RING TRIGGER FINGER  SURGEON:  Franky Curia, MD  ASSISTANT:  Isaiah Anton, [facility]  ANESTHESIA:  General.  IV FLUIDS:  Per anesthesia flow sheet.  ESTIMATED BLOOD LOSS:  Minimal.  COMPLICATIONS:  None.  SPECIMENS:  None.  TOURNIQUET TIME:  Total Tourniquet Time Documented: Forearm (Left) - 17 minutes Total: Forearm (Left) - 17 minutes   DISPOSITION:  Stable to PACU.  LOCATION: [facility]  INDICATIONS: Jose Oliver is a 58 y.o. male with triggering of the long finger, ring finger.  He wishes to forego injections.  He wishes to proceed with surgical trigger release.  Risks, benefits and alternatives of surgery were discussed including the risk of blood loss, infection, damage to nerves, vessels, tendons, ligaments, bone, failure of surgery, need for additional surgery, complications with wound healing, continued pain, continued triggering and need for repeat surgery.  He voiced understanding of these risks and elected to proceed.  OPERATIVE COURSE:  After being identified preoperatively by myself, the patient and I agreed upon the procedure and site of procedure.  The surgical site was marked. Surgical consent had been signed. He was given IV Ancef  as preoperative antibiotic prophylaxis. He was transported to the operating room and placed on the operating room table in supine position with the Left upper extremity on an arm board. General anesthesia was induced by the anesthesiologist.  The Left upper extremity was prepped and draped in normal sterile orthopedic fashion. A surgical pause was performed between surgeons, anesthesia, and operating room staff,  and all were in agreement as to the patient, procedure, and site of procedure.  Tourniquet at the proximal aspect of the extremity was inflated to 250 mmHg after exsanguination of the arm with an Esmarch bandage.  An incision was made at the volar aspect of the MP joint of the long finger.  This was carried into the subcutaneous tissues by spreading technique.  Bipolar electrocautery was used to obtain hemostasis.  The radial and ulnar digital nerves were protected throughout the case. The flexor sheath was identified.  The A1 pulley was identified and sharply incised.  It was released in its entirety.  The proximal 1-2 mm of the A2 pulley was vented to allow better excursion of the tendons.  The finger was placed through a range of motion and there was noted to be no catching.  The tendons were brought through the wound and any adherences released.  An incision was made at the volar aspect of the MP joint of the ring finger.  This was carried into the subcutaneous tissues by spreading technique.  Bipolar electrocautery was used to obtain hemostasis.  The radial and ulnar digital nerves were protected throughout the case. The flexor sheath was identified.  The A1 pulley was identified and sharply incised.  It was released in its entirety.  The proximal 1-2 mm of the A2 pulley was vented to allow better excursion of the tendons.  The finger was placed through a range of motion and there was noted to be no catching.  The tendons were brought through the wound and any adherences released.  The wounds were then copiously irrigated with sterile saline. They were closed with 4-0 nylon in a horizontal  mattress fashion.  They were injected with 0.25% plain Marcaine  to aid in postoperative analgesia.  They were dressed with sterile Xeroform, 4x4s, and wrapped lightly with a Coban dressing.  Tourniquet was deflated at 17 minutes.  The fingertips were pink with brisk capillary refill after deflation of the tourniquet.  The  operative drapes were broken down and the patient was awoken from anesthesia safely.  He was transferred back to the stretcher and taken to the PACU in stable condition.   I will see him back in the office in 1 week for postoperative followup.  I will give him a prescription for Norco 5/325 1 tab PO q6 hours prn pain, dispense #15.    [name], MD Electronically signed, 04/07/24

## 2024-04-07 NOTE — Anesthesia Procedure Notes (Signed)
 Procedure Name: LMA Insertion Date/Time: 04/07/2024 1:00 PM  Performed by: Pam Macario BROCKS, CRNAPre-anesthesia Checklist: Patient identified, Emergency Drugs available, Suction available, Patient being monitored and Timeout performed Patient Re-evaluated:Patient Re-evaluated prior to induction Oxygen Delivery Method: Circle system utilized Preoxygenation: Pre-oxygenation with 100% oxygen Induction Type: IV induction Ventilation: Mask ventilation without difficulty LMA: LMA inserted LMA Size: 4.0 Number of attempts: 1 Airway Equipment and Method: Bite block Placement Confirmation: positive ETCO2, breath sounds checked- equal and bilateral and CO2 detector Tube secured with: Tape Dental Injury: Teeth and Oropharynx as per pre-operative assessment

## 2024-04-08 ENCOUNTER — Encounter (HOSPITAL_BASED_OUTPATIENT_CLINIC_OR_DEPARTMENT_OTHER): Payer: Self-pay | Admitting: Orthopedic Surgery
